# Patient Record
Sex: Female | Born: 1955 | Race: White | Hispanic: No | Smoking: Former smoker
Health system: Southern US, Community
[De-identification: ages and names within clinical notes are randomized; demographics above are authoritative.]

## PROBLEM LIST (undated history)

## (undated) DIAGNOSIS — F419 Anxiety disorder, unspecified: Secondary | ICD-10-CM

## (undated) DIAGNOSIS — K5090 Crohn's disease, unspecified, without complications: Secondary | ICD-10-CM

## (undated) DIAGNOSIS — M810 Age-related osteoporosis without current pathological fracture: Secondary | ICD-10-CM

## (undated) DIAGNOSIS — K5010 Crohn's disease of large intestine without complications: Secondary | ICD-10-CM

## (undated) DIAGNOSIS — I724 Aneurysm of artery of lower extremity: Secondary | ICD-10-CM

## (undated) HISTORY — DX: Crohn's disease, unspecified, without complications: K50.90

## (undated) HISTORY — PX: WISDOM TOOTH EXTRACTION: SHX21

## (undated) HISTORY — PX: COLON SURGERY: SHX602

## (undated) HISTORY — PX: ABDOMINAL AORTAGRAM: SHX5706

## (undated) HISTORY — PX: DILATION AND CURETTAGE OF UTERUS: SHX78

## (undated) HISTORY — PX: COLONOSCOPY W/ BIOPSIES AND POLYPECTOMY: SHX1376

## (undated) HISTORY — DX: Age-related osteoporosis without current pathological fracture: M81.0

## (undated) HISTORY — DX: Crohn's disease of large intestine without complications: K50.10

---

## 1998-09-19 ENCOUNTER — Inpatient Hospital Stay (HOSPITAL_COMMUNITY): Admission: AD | Admit: 1998-09-19 | Discharge: 1998-09-21 | Payer: Self-pay | Admitting: Obstetrics and Gynecology

## 1998-11-02 ENCOUNTER — Other Ambulatory Visit: Admission: RE | Admit: 1998-11-02 | Discharge: 1998-11-02 | Payer: Self-pay | Admitting: Obstetrics & Gynecology

## 2002-02-23 ENCOUNTER — Other Ambulatory Visit: Admission: RE | Admit: 2002-02-23 | Discharge: 2002-02-23 | Payer: Self-pay | Admitting: Obstetrics and Gynecology

## 2003-12-04 ENCOUNTER — Other Ambulatory Visit: Admission: RE | Admit: 2003-12-04 | Discharge: 2003-12-04 | Payer: Self-pay | Admitting: Obstetrics & Gynecology

## 2011-12-02 ENCOUNTER — Other Ambulatory Visit: Payer: Self-pay | Admitting: Obstetrics & Gynecology

## 2011-12-02 DIAGNOSIS — R928 Other abnormal and inconclusive findings on diagnostic imaging of breast: Secondary | ICD-10-CM

## 2011-12-08 ENCOUNTER — Ambulatory Visit
Admission: RE | Admit: 2011-12-08 | Discharge: 2011-12-08 | Disposition: A | Discharge status: Home / Self Care | Payer: BC Managed Care – PPO | Source: Ambulatory Visit | Attending: Obstetrics & Gynecology | Admitting: Obstetrics & Gynecology

## 2011-12-08 DIAGNOSIS — R928 Other abnormal and inconclusive findings on diagnostic imaging of breast: Secondary | ICD-10-CM

## 2012-07-27 ENCOUNTER — Ambulatory Visit (INDEPENDENT_AMBULATORY_CARE_PROVIDER_SITE_OTHER): Payer: BC Managed Care – PPO | Admitting: Family Medicine

## 2012-07-27 DIAGNOSIS — E538 Deficiency of other specified B group vitamins: Secondary | ICD-10-CM

## 2012-07-27 MED ORDER — CYANOCOBALAMIN 1000 MCG/ML IJ SOLN
1000.0000 ug | Freq: Once | INTRAMUSCULAR | Status: AC
  Administered 2012-07-27: 1000 ug via INTRAMUSCULAR

## 2012-08-03 ENCOUNTER — Other Ambulatory Visit: Payer: Self-pay | Admitting: Family Medicine

## 2012-08-03 ENCOUNTER — Ambulatory Visit
Admission: RE | Admit: 2012-08-03 | Discharge: 2012-08-03 | Disposition: A | Discharge status: Home / Self Care | Payer: BC Managed Care – PPO | Source: Ambulatory Visit | Attending: Physician Assistant | Admitting: Physician Assistant

## 2012-08-03 ENCOUNTER — Ambulatory Visit: Payer: BC Managed Care – PPO

## 2012-08-03 ENCOUNTER — Other Ambulatory Visit: Payer: Self-pay | Admitting: Physician Assistant

## 2012-08-03 DIAGNOSIS — M545 Low back pain: Secondary | ICD-10-CM

## 2012-08-05 ENCOUNTER — Telehealth: Payer: Self-pay | Admitting: Family Medicine

## 2012-08-05 NOTE — Telephone Encounter (Signed)
Pt called and made aware

## 2012-08-05 NOTE — Telephone Encounter (Signed)
Message copied by Donne Anon on Thu Aug 05, 2012 12:10 PM ------      Message from: Allayne Butcher      Created: Wed Aug 04, 2012  7:52 AM       One disc has slipped very minimally-only 4mm. Felt to be secondary to degenerative disc. No other significant findings-o/w nml.            LOV was 12/13. If having increased symptoms (increased back pain or if now with pain/numbness/tingling down either leg then needs to schedule f/u OV to further evaluate. ------

## 2012-08-27 ENCOUNTER — Ambulatory Visit: Payer: BC Managed Care – PPO

## 2012-08-30 ENCOUNTER — Ambulatory Visit (INDEPENDENT_AMBULATORY_CARE_PROVIDER_SITE_OTHER): Payer: BC Managed Care – PPO | Admitting: *Deleted

## 2012-08-30 DIAGNOSIS — E538 Deficiency of other specified B group vitamins: Secondary | ICD-10-CM

## 2012-08-31 MED ORDER — CYANOCOBALAMIN 1000 MCG/ML IJ SOLN: 1000.0000 ug | Freq: Once | INTRAMUSCULAR | Status: DC

## 2013-02-11 ENCOUNTER — Ambulatory Visit (INDEPENDENT_AMBULATORY_CARE_PROVIDER_SITE_OTHER): Payer: BC Managed Care – PPO | Admitting: Family Medicine

## 2013-02-11 DIAGNOSIS — E538 Deficiency of other specified B group vitamins: Secondary | ICD-10-CM

## 2013-02-11 MED ORDER — CYANOCOBALAMIN 1000 MCG/ML IJ SOLN
1000.0000 ug | Freq: Once | INTRAMUSCULAR | Status: AC
  Administered 2013-02-11: 1000 ug via INTRAMUSCULAR

## 2013-02-14 ENCOUNTER — Ambulatory Visit (INDEPENDENT_AMBULATORY_CARE_PROVIDER_SITE_OTHER): Payer: BC Managed Care – PPO | Admitting: Physician Assistant

## 2013-02-14 ENCOUNTER — Encounter: Payer: Self-pay | Admitting: Physician Assistant

## 2013-02-14 VITALS — BP 110/70 | HR 76 | Temp 98.1°F | Resp 18 | Ht 59.5 in | Wt 110.0 lb

## 2013-02-14 DIAGNOSIS — K5010 Crohn's disease of large intestine without complications: Secondary | ICD-10-CM | POA: Insufficient documentation

## 2013-02-14 DIAGNOSIS — L720 Epidermal cyst: Secondary | ICD-10-CM

## 2013-02-14 DIAGNOSIS — L723 Sebaceous cyst: Secondary | ICD-10-CM

## 2013-02-14 MED ORDER — SULFAMETHOXAZOLE-TMP DS 800-160 MG PO TABS: 1.0000 | ORAL_TABLET | Freq: Two times a day (BID) | ORAL | Status: DC

## 2013-02-14 NOTE — Progress Notes (Signed)
   Patient ID: Karen Barnett MRN: [id number], DOB: 11/09/1955, 57 y.o. Date of Encounter: 02/14/2013, 1:09 PM    Chief Complaint:  Chief Complaint  Patient presents with  . c/o lesion on left upper arm  . pain behind rt knee  . left neck pain    feels lump there  . rash in left inguinal area     HPI: 57 y.o. year old white female with multiple things to check:  1- itchy rash on left flank for about one week. Has used no medications other than just plain lotion.  2- has had cyst on left upper arm for years. Had caused no problems until just the past 2 or 3 weeks it became irritated. Last week it opened and drained some blood as well as some white drainage.  3- has noticed that she could feel a stronger pulse on the back of her right knee than on the left.     Home Meds: See attached medication section for any medications that were entered at today's visit. The computer does not put those onto this list.The following list is a list of meds entered prior to today's visit.   Current Outpatient Prescriptions on File Prior to Visit  Medication Sig Dispense Refill  . cyanocobalamin (,VITAMIN B-12,) 1000 MCG/ML injection Inject 1,000 mcg into the muscle every 30 (thirty) days.       Current Facility-Administered Medications on File Prior to Visit  Medication Dose Route Frequency Provider Last Rate Last Dose  . cyanocobalamin ((VITAMIN B-12)) injection 1,000 mcg  1,000 mcg Intramuscular Once Dorena Bodo, PA-C        Allergies: No Known Allergies    Review of Systems: See HPI for pertinent ROS. All other ROS negative.    Physical Exam: Blood pressure 110/70, pulse 76, temperature 98.1 F (36.7 C), temperature source Oral, resp. rate 18, height 4' 11.5" (1.511 m), weight 110 lb (49.896 kg)., Body mass index is 21.85 kg/(m^2). General: Well-nourished well-developed white female. Appears in no acute distress. Neck: Supple. No thyromegaly. No lymphadenopathy. Left neck has a very  small area of firmness consistent with a cyst. Approximately 1/3 cm diameter or less. It is mobile with  very distinct margins. Lungs: Clear bilaterally to auscultation without wheezes, rales, or rhonchi. Breathing is unlabored. Heart: Regular rhythm. No murmurs, rubs, or gallops. Msk:  Strength and tone normal for age. Elevated the posterior aspect of both knees and both were normal. I do feel a full bounding 2+ popliteal pulse bilaterally. Extremities/Skin: Left Flank : Approximate 1 inch diameter area with multiple scattered tiny pink papules. Left upper arm on the anterior lateral aspect: Approximate 1 cm area of firmness with some erythema. No draining. Neuro: Alert and oriented X 3. Moves all extremities spontaneously. Gait is normal. CNII-XII grossly in tact. Psych:  Responds to questions appropriately with a normal affect.     ASSESSMENT AND PLAN:  57 y.o. year old female with  1. Epidermoid cyst-Inflamed, Infected. This has already drained. Treat with antibiotics. Can return for resection of the cyst. - sulfamethoxazole-trimethoprim (BACTRIM DS) 800-160 MG per tablet; Take 1 tablet by mouth 2 (two) times daily.  Dispense: 20 tablet; Refill: 0  2- dermatitis of the left flank: Apply over-the-counter cortisone cream.  3- Cyst of left neck- Reassured her this is c/w cyst. Mobile, distint borders.   Murray Hodgkins Candelaria Arenas, [place], [facility] 02/14/2013 1:09 PM

## 2013-06-09 ENCOUNTER — Other Ambulatory Visit: Payer: Self-pay | Admitting: Physician Assistant

## 2013-06-10 NOTE — Telephone Encounter (Signed)
Medication refilled per protocol. 

## 2014-04-14 ENCOUNTER — Ambulatory Visit (INDEPENDENT_AMBULATORY_CARE_PROVIDER_SITE_OTHER): Payer: BC Managed Care – PPO | Admitting: *Deleted

## 2014-04-14 DIAGNOSIS — Z23 Encounter for immunization: Secondary | ICD-10-CM

## 2014-04-14 DIAGNOSIS — E538 Deficiency of other specified B group vitamins: Secondary | ICD-10-CM

## 2014-04-14 MED ORDER — CYANOCOBALAMIN 1000 MCG/ML IJ SOLN
1000.0000 ug | Freq: Once | INTRAMUSCULAR | Status: AC
  Administered 2014-04-14: 1000 ug via INTRAMUSCULAR

## 2014-06-27 ENCOUNTER — Ambulatory Visit (INDEPENDENT_AMBULATORY_CARE_PROVIDER_SITE_OTHER): Payer: BLUE CROSS/BLUE SHIELD | Admitting: Family Medicine

## 2014-06-27 DIAGNOSIS — E538 Deficiency of other specified B group vitamins: Secondary | ICD-10-CM

## 2014-06-27 MED ORDER — CYANOCOBALAMIN 1000 MCG/ML IJ SOLN
1000.0000 ug | Freq: Once | INTRAMUSCULAR | Status: AC
  Administered 2014-06-27: 1000 ug via INTRAMUSCULAR

## 2014-07-10 ENCOUNTER — Ambulatory Visit (INDEPENDENT_AMBULATORY_CARE_PROVIDER_SITE_OTHER): Payer: BLUE CROSS/BLUE SHIELD | Admitting: Family Medicine

## 2014-07-10 ENCOUNTER — Encounter: Payer: Self-pay | Admitting: Family Medicine

## 2014-07-10 VITALS — BP 110/82 | HR 76 | Temp 98.6°F | Resp 14 | Wt 111.0 lb

## 2014-07-10 DIAGNOSIS — I724 Aneurysm of artery of lower extremity: Secondary | ICD-10-CM

## 2014-07-10 LAB — CBC WITH DIFFERENTIAL/PLATELET
BASOS ABS: 0 10*3/uL (ref 0.0–0.1)
Basophils Relative: 0 % (ref 0–1)
EOS PCT: 1 % (ref 0–5)
Eosinophils Absolute: 0.1 10*3/uL (ref 0.0–0.7)
HCT: 39.7 % (ref 36.0–46.0)
Hemoglobin: 12.9 g/dL (ref 12.0–15.0)
LYMPHS ABS: 3.3 10*3/uL (ref 0.7–4.0)
LYMPHS PCT: 38 % (ref 12–46)
MCH: 29.6 pg (ref 26.0–34.0)
MCHC: 32.5 g/dL (ref 30.0–36.0)
MCV: 91.1 fL (ref 78.0–100.0)
MPV: 9.6 fL (ref 8.6–12.4)
Monocytes Absolute: 0.6 10*3/uL (ref 0.1–1.0)
Monocytes Relative: 7 % (ref 3–12)
Neutro Abs: 4.6 10*3/uL (ref 1.7–7.7)
Neutrophils Relative %: 54 % (ref 43–77)
PLATELETS: 304 10*3/uL (ref 150–400)
RBC: 4.36 MIL/uL (ref 3.87–5.11)
RDW: 15.2 % (ref 11.5–15.5)
WBC: 8.6 10*3/uL (ref 4.0–10.5)

## 2014-07-10 LAB — COMPLETE METABOLIC PANEL WITH GFR
ALBUMIN: 4.3 g/dL (ref 3.5–5.2)
ALK PHOS: 51 U/L (ref 39–117)
ALT: 15 U/L (ref 0–35)
AST: 16 U/L (ref 0–37)
BUN: 8 mg/dL (ref 6–23)
CALCIUM: 9.4 mg/dL (ref 8.4–10.5)
CHLORIDE: 102 meq/L (ref 96–112)
CO2: 31 mEq/L (ref 19–32)
Creat: 0.69 mg/dL (ref 0.50–1.10)
GFR, Est African American: >89 mL/min
GFR, Est Non African American: >89 mL/min
GLUCOSE: 86 mg/dL (ref 70–99)
POTASSIUM: 4.3 meq/L (ref 3.5–5.3)
Sodium: 140 mEq/L (ref 135–145)
TOTAL PROTEIN: 6.1 g/dL (ref 6.0–8.3)
Total Bilirubin: 0.4 mg/dL (ref 0.2–1.2)

## 2014-07-10 MED ORDER — CYANOCOBALAMIN 1000 MCG/ML IJ SOLN: INTRAMUSCULAR | Status: DC

## 2014-07-10 NOTE — Progress Notes (Signed)
   Subjective:    Patient ID: Karen Barnett, female    DOB: May 24, 1955, 59 y.o.   MRN: [id number]  HPI Patient presents today with stiffness in her right knee. The stiffness has been there for approximately 1 year. She denies any injuries. She denies any swelling. She states that the stiffness and pain pulses at times. She denies any symptoms of claudication in the legs. She denies any back pain or radiation of the pain down the leg. She denies any erythema or effusion in the knee. Past Medical History  Diagnosis Date  . Crohn's colitis    No past surgical history on file. No current outpatient prescriptions on file prior to visit.   No current facility-administered medications on file prior to visit.   No Known Allergies History   Social History  . Marital Status: Single    Spouse Name: N/A  . Number of Children: N/A  . Years of Education: N/A   Occupational History  . Not on file.   Social History Main Topics  . Smoking status: Former Smoker    Quit date: 02/14/2009  . Smokeless tobacco: Never Used  . Alcohol Use: Not on file  . Drug Use: Not on file  . Sexual Activity: Not on file   Other Topics Concern  . Not on file   Social History Narrative      Review of Systems  All other systems reviewed and are negative.      Objective:   Physical Exam  Cardiovascular: Normal rate, regular rhythm and normal heart sounds.   Pulses:      Femoral pulses are 2+ on the right side, and 2+ on the left side.      Popliteal pulses are 3+ on the right side, and 2+ on the left side.  Pulmonary/Chest: Effort normal and breath sounds normal. No respiratory distress. She has no wheezes. She has no rales.  Abdominal: Soft. Bowel sounds are normal. She exhibits no distension. There is no tenderness. There is no rebound.  Musculoskeletal:       Right knee: She exhibits deformity. She exhibits normal range of motion, no swelling, no effusion, no ecchymosis, no erythema, normal  alignment, no LCL laxity, normal patellar mobility, no bony tenderness, normal meniscus and no MCL laxity. No tenderness found. No medial joint line, no lateral joint line, no MCL and no LCL tenderness noted.  Vitals reviewed.  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       Assessment & Plan:  Popliteal artery aneurysm - Plan: CBC with Differential/Platelet, COMPLETE METABOLIC PANEL WITH GFR, Lower Extremity Arterial Doppler Bilateral  I will schedule the patient for a vascular ultrasound/lower sternal the arterial Doppler to evaluate further. If this confirms a popliteal artery aneurysm, I would recommend referral to vascular surgeon for operative repair.

## 2014-07-11 ENCOUNTER — Encounter: Payer: Self-pay | Admitting: Family Medicine

## 2014-07-18 ENCOUNTER — Other Ambulatory Visit (HOSPITAL_COMMUNITY): Payer: Self-pay | Admitting: Cardiology

## 2014-07-18 ENCOUNTER — Ambulatory Visit (HOSPITAL_COMMUNITY): Payer: BLUE CROSS/BLUE SHIELD | Attending: Cardiovascular Disease | Admitting: *Deleted

## 2014-07-18 ENCOUNTER — Other Ambulatory Visit: Payer: Self-pay | Admitting: Family Medicine

## 2014-07-18 DIAGNOSIS — I724 Aneurysm of artery of lower extremity: Secondary | ICD-10-CM | POA: Diagnosis present

## 2014-07-18 NOTE — Progress Notes (Signed)
Bilateral lower extremity arterial duplex

## 2014-07-19 ENCOUNTER — Telehealth: Payer: Self-pay | Admitting: Physician Assistant

## 2014-07-19 ENCOUNTER — Telehealth: Payer: Self-pay | Admitting: *Deleted

## 2014-07-19 NOTE — Telephone Encounter (Signed)
Pt [facility] on Tues March 22 at 1:15 for 2:00pm appt, left message on voice mail for pt to return my call for appt date/time and location

## 2014-07-19 NOTE — Telephone Encounter (Signed)
Pt aware of appt.

## 2014-07-19 NOTE — Telephone Encounter (Signed)
Harriet from [place] vascular lab wanting to talk with you regarding patient having a aneurysm  Please call her at [phone]

## 2014-07-20 NOTE — Telephone Encounter (Signed)
Completed by referral coordinator

## 2014-07-24 ENCOUNTER — Encounter: Payer: Self-pay | Admitting: Vascular Surgery

## 2014-07-25 ENCOUNTER — Ambulatory Visit (HOSPITAL_COMMUNITY)
Admission: RE | Admit: 2014-07-25 | Discharge: 2014-07-25 | Disposition: A | Discharge status: Home / Self Care | Payer: BLUE CROSS/BLUE SHIELD | Source: Ambulatory Visit | Attending: Vascular Surgery | Admitting: Vascular Surgery

## 2014-07-25 ENCOUNTER — Ambulatory Visit (INDEPENDENT_AMBULATORY_CARE_PROVIDER_SITE_OTHER): Payer: BLUE CROSS/BLUE SHIELD | Admitting: Vascular Surgery

## 2014-07-25 ENCOUNTER — Other Ambulatory Visit: Payer: Self-pay

## 2014-07-25 ENCOUNTER — Encounter: Payer: Self-pay | Admitting: Vascular Surgery

## 2014-07-25 VITALS — BP 129/84 | HR 79 | Resp 16 | Ht 59.0 in | Wt 108.0 lb

## 2014-07-25 DIAGNOSIS — Z01810 Encounter for preprocedural cardiovascular examination: Secondary | ICD-10-CM | POA: Diagnosis not present

## 2014-07-25 DIAGNOSIS — I724 Aneurysm of artery of lower extremity: Secondary | ICD-10-CM

## 2014-07-25 NOTE — Progress Notes (Signed)
Subjective:     Patient ID: Karen Barnett, female   DOB: 09/17/1955, 59 y.o.   MRN: [id number]  HPI this 59-year-old female was referred for evaluation of her right popliteal artery aneurysm. She has been having pain behind the right knee joint for the past 12 months which has increased in intensity. She has no history of trauma to this area. She denies a history of abdominal aortic aneurysm or a family history of abdominal aortic aneurysm. She does have a grandfather had a cerebral aneurysm. 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 She has no history of ischemia of the right foot. She denies claudication in the right calf able to ambulate long distances. She has a history of smoking but does not currently smoke.  Past Medical History  Diagnosis Date  . Crohn's colitis   . Crohn's disease     History  Substance Use Topics  . Smoking status: Former Smoker    Quit date: 02/14/2009  . Smokeless tobacco: Never Used  . Alcohol Use: 2.4 oz/week    4 Shots of liquor per week    Family History  Problem Relation Age of Onset  . Diabetes Father   . Diabetes Brother     No Known Allergies   Current outpatient prescriptions:  .  cyanocobalamin (,VITAMIN B-12,) 1000 MCG/ML injection, INJECT 1ML EVERY MONTH, Disp: 10 mL, Rfl: 0  Filed Vitals:   07/25/14 1337  BP: 129/84  Pulse: 79  Resp: 16  Height: 4' 11" (1.499 m)  Weight: 108 lb (48.988 kg)    Body mass index is 21.8 kg/(m^2).           Review of Systems denies chest pain, dyspnea on exertion, PND, orthopnea    does have a history of Crohn's colitis.Other systems negative and a complete review of systems  Objective:   Physical Exam BP 129/84 mmHg  Pulse 79  Resp 16  Ht 4' 11" (1.499 m)  Wt 108 lb (48.988 kg)  BMI 21.80 kg/m2  Gen.-alert and oriented x3 in no apparent distress HEENT normal for age Lungs no rhonchi or wheezing Cardiovascular regular rhythm no murmurs carotid  pulses 3+ palpable no bruits audible Abdomen soft nontender no palpable masses Musculoskeletal free of  major deformities Skin clear -no rashes Neurologic normal Lower extremities 3+ femoral and 2+ dorsalis pedis and posterior tibial pulses palpable bilaterally with no edema 4 cm pulsatile mass in right popliteal fossa. No popliteal mass noted on the left side.  Today I reviewed the report of the lower extremity scan performed at another vascular lab last week. 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      

## 2014-07-31 MED ORDER — SODIUM CHLORIDE 0.9 % IV SOLN
INTRAVENOUS | Status: DC
  Administered 2014-08-01: 10:00:00 via INTRAVENOUS

## 2014-08-01 ENCOUNTER — Ambulatory Visit (HOSPITAL_COMMUNITY)
Admission: RE | Admit: 2014-08-01 | Discharge: 2014-08-01 | Disposition: A | Discharge status: Home / Self Care | Payer: BLUE CROSS/BLUE SHIELD | Source: Ambulatory Visit | Attending: Surgery | Admitting: Surgery

## 2014-08-01 ENCOUNTER — Other Ambulatory Visit: Payer: Self-pay

## 2014-08-01 ENCOUNTER — Encounter (HOSPITAL_COMMUNITY)
Admission: RE | Disposition: A | Discharge status: Home / Self Care | Payer: Self-pay | Source: Ambulatory Visit | Attending: Surgery

## 2014-08-01 ENCOUNTER — Ambulatory Visit (HOSPITAL_COMMUNITY): Payer: BLUE CROSS/BLUE SHIELD

## 2014-08-01 ENCOUNTER — Encounter (HOSPITAL_COMMUNITY): Payer: Self-pay | Admitting: *Deleted

## 2014-08-01 ENCOUNTER — Encounter (HOSPITAL_COMMUNITY): Payer: Self-pay | Admitting: Vascular Surgery

## 2014-08-01 DIAGNOSIS — K5900 Constipation, unspecified: Secondary | ICD-10-CM | POA: Diagnosis not present

## 2014-08-01 DIAGNOSIS — I724 Aneurysm of artery of lower extremity: Secondary | ICD-10-CM | POA: Diagnosis not present

## 2014-08-01 DIAGNOSIS — Z87891 Personal history of nicotine dependence: Secondary | ICD-10-CM

## 2014-08-01 DIAGNOSIS — I7789 Other specified disorders of arteries and arterioles: Secondary | ICD-10-CM

## 2014-08-01 DIAGNOSIS — K5010 Crohn's disease of large intestine without complications: Secondary | ICD-10-CM | POA: Diagnosis present

## 2014-08-01 HISTORY — DX: Anxiety disorder, unspecified: F41.9

## 2014-08-01 HISTORY — DX: Aneurysm of artery of lower extremity: I72.4

## 2014-08-01 HISTORY — PX: ABDOMINAL AORTAGRAM: SHX5454

## 2014-08-01 LAB — CBC
HCT: 35.3 % — ABNORMAL LOW (ref 36.0–46.0)
Hemoglobin: 11.7 g/dL — ABNORMAL LOW (ref 12.0–15.0)
MCH: 29.4 pg (ref 26.0–34.0)
MCHC: 33.1 g/dL (ref 30.0–36.0)
MCV: 88.7 fL (ref 78.0–100.0)
Platelets: 236 10*3/uL (ref 150–400)
RBC: 3.98 MIL/uL (ref 3.87–5.11)
RDW: 14.5 % (ref 11.5–15.5)
WBC: 6.2 10*3/uL (ref 4.0–10.5)

## 2014-08-01 LAB — COMPREHENSIVE METABOLIC PANEL
ALK PHOS: 45 U/L (ref 39–117)
ALT: 14 U/L (ref 0–35)
ANION GAP: 9 (ref 5–15)
AST: 16 U/L (ref 0–37)
Albumin: 3.4 g/dL — ABNORMAL LOW (ref 3.5–5.2)
BUN: 7 mg/dL (ref 6–23)
CO2: 25 mmol/L (ref 19–32)
CREATININE: 0.63 mg/dL (ref 0.50–1.10)
Calcium: 8.6 mg/dL (ref 8.4–10.5)
Chloride: 106 mmol/L (ref 96–112)
GFR calc Af Amer: >90 mL/min (ref >90)
Glucose, Bld: 141 mg/dL — ABNORMAL HIGH (ref 70–99)
POTASSIUM: 3.6 mmol/L (ref 3.5–5.1)
SODIUM: 140 mmol/L (ref 135–145)
Total Bilirubin: 0.6 mg/dL (ref 0.3–1.2)
Total Protein: 5.3 g/dL — ABNORMAL LOW (ref 6.0–8.3)

## 2014-08-01 LAB — APTT: aPTT: 30 seconds (ref 24–37)

## 2014-08-01 LAB — PROTIME-INR
INR: 1.06 (ref 0.00–1.49)
Prothrombin Time: 13.9 seconds (ref 11.6–15.2)

## 2014-08-01 LAB — URINALYSIS, ROUTINE W REFLEX MICROSCOPIC
Bilirubin Urine: NEGATIVE
Glucose, UA: NEGATIVE mg/dL
Hgb urine dipstick: NEGATIVE
Ketones, ur: NEGATIVE mg/dL
Leukocytes, UA: NEGATIVE
Nitrite: NEGATIVE
Protein, ur: 30 mg/dL — AB
Specific Gravity, Urine: 1.015 (ref 1.005–1.030)
Urobilinogen, UA: 0.2 mg/dL (ref 0.0–1.0)
pH: 5 (ref 5.0–8.0)

## 2014-08-01 LAB — TYPE AND SCREEN
ABO/RH(D): AB POS
Antibody Screen: NEGATIVE

## 2014-08-01 LAB — POCT I-STAT, CHEM 8
BUN: 7 mg/dL (ref 6–23)
CHLORIDE: 102 mmol/L (ref 96–112)
CREATININE: 0.6 mg/dL (ref 0.50–1.10)
Calcium, Ion: 1.2 mmol/L (ref 1.12–1.23)
Glucose, Bld: 98 mg/dL (ref 70–99)
HEMATOCRIT: 40 % (ref 36.0–46.0)
Hemoglobin: 13.6 g/dL (ref 12.0–15.0)
Potassium: 3.6 mmol/L (ref 3.5–5.1)
Sodium: 141 mmol/L (ref 135–145)
TCO2: 25 mmol/L (ref 0–100)

## 2014-08-01 LAB — URINE MICROSCOPIC-ADD ON

## 2014-08-01 LAB — SURGICAL PCR SCREEN
MRSA, PCR: NEGATIVE
STAPHYLOCOCCUS AUREUS: POSITIVE — AB

## 2014-08-01 LAB — ABO/RH: ABO/RH(D): AB POS

## 2014-08-01 SURGERY — ABDOMINAL AORTAGRAM

## 2014-08-01 MED ORDER — METOPROLOL TARTRATE 1 MG/ML IV SOLN
2.0000 mg | INTRAVENOUS | Status: DC | PRN
Start: 2014-08-01 — End: 2014-08-01

## 2014-08-01 MED ORDER — MORPHINE SULFATE 10 MG/ML IJ SOLN: 2.0000 mg | INTRAMUSCULAR | Status: DC | PRN

## 2014-08-01 MED ORDER — CHLORHEXIDINE GLUCONATE CLOTH 2 % EX PADS: 6.0000 | MEDICATED_PAD | Freq: Once | CUTANEOUS | Status: DC

## 2014-08-01 MED ORDER — DOCUSATE SODIUM 100 MG PO CAPS: 100.0000 mg | ORAL_CAPSULE | Freq: Every day | ORAL | Status: DC

## 2014-08-01 MED ORDER — SODIUM CHLORIDE 0.9 % IV SOLN: 1.0000 mL/kg/h | INTRAVENOUS | Status: DC

## 2014-08-01 MED ORDER — PHENOL 1.4 % MT LIQD
1.0000 | OROMUCOSAL | Status: DC | PRN
Start: 2014-08-01 — End: 2014-08-01
  Filled 2014-08-01: qty 177

## 2014-08-01 MED ORDER — ACETAMINOPHEN 325 MG PO TABS: 325.0000 mg | ORAL_TABLET | ORAL | Status: DC | PRN

## 2014-08-01 MED ORDER — ONDANSETRON HCL 4 MG/2ML IJ SOLN: 4.0000 mg | Freq: Four times a day (QID) | INTRAMUSCULAR | Status: DC | PRN

## 2014-08-01 MED ORDER — MIDAZOLAM HCL 2 MG/2ML IJ SOLN
INTRAMUSCULAR | Status: AC
  Filled 2014-08-01: qty 2

## 2014-08-01 MED ORDER — OXYCODONE HCL 5 MG PO TABS: 5.0000 mg | ORAL_TABLET | ORAL | Status: DC | PRN

## 2014-08-01 MED ORDER — DEXTROSE 5 % IV SOLN
1.5000 g | INTRAVENOUS | Status: AC
  Administered 2014-08-02: 1.5 g via INTRAVENOUS
  Filled 2014-08-01: qty 1.5

## 2014-08-01 MED ORDER — SODIUM CHLORIDE 0.9 % IV SOLN: INTRAVENOUS | Status: DC

## 2014-08-01 MED ORDER — LIDOCAINE HCL (PF) 1 % IJ SOLN
INTRAMUSCULAR | Status: AC
  Filled 2014-08-01: qty 30

## 2014-08-01 MED ORDER — LABETALOL HCL 5 MG/ML IV SOLN: 10.0000 mg | INTRAVENOUS | Status: DC | PRN

## 2014-08-01 MED ORDER — ALUM & MAG HYDROXIDE-SIMETH 200-200-20 MG/5ML PO SUSP: 15.0000 mL | ORAL | Status: DC | PRN

## 2014-08-01 MED ORDER — HEPARIN (PORCINE) IN NACL 2-0.9 UNIT/ML-% IJ SOLN
INTRAMUSCULAR | Status: AC
  Filled 2014-08-01: qty 1000

## 2014-08-01 MED ORDER — GUAIFENESIN-DM 100-10 MG/5ML PO SYRP
15.0000 mL | ORAL_SOLUTION | ORAL | Status: DC | PRN
  Filled 2014-08-01: qty 15

## 2014-08-01 MED ORDER — HYDRALAZINE HCL 20 MG/ML IJ SOLN: 5.0000 mg | INTRAMUSCULAR | Status: DC | PRN

## 2014-08-01 MED ORDER — ACETAMINOPHEN 325 MG RE SUPP: 325.0000 mg | RECTAL | Status: DC | PRN

## 2014-08-01 MED ORDER — FENTANYL CITRATE 0.05 MG/ML IJ SOLN
INTRAMUSCULAR | Status: AC
  Filled 2014-08-01: qty 2

## 2014-08-01 SURGICAL SUPPLY — 53 items
BANDAGE ELASTIC 4 VELCRO ST LF (GAUZE/BANDAGES/DRESSINGS) IMPLANT
BANDAGE ESMARK 6X9 LF (GAUZE/BANDAGES/DRESSINGS) IMPLANT
BNDG ESMARK 6X9 LF (GAUZE/BANDAGES/DRESSINGS)
CANISTER SUCTION 2500CC (MISCELLANEOUS) ×3 IMPLANT
CLIP TI MEDIUM 24 (CLIP) ×3 IMPLANT
CLIP TI WIDE RED SMALL 24 (CLIP) ×3 IMPLANT
COVER SURGICAL LIGHT HANDLE (MISCELLANEOUS) ×3 IMPLANT
CUFF TOURNIQUET SINGLE 24IN (TOURNIQUET CUFF) IMPLANT
CUFF TOURNIQUET SINGLE 34IN LL (TOURNIQUET CUFF) IMPLANT
CUFF TOURNIQUET SINGLE 44IN (TOURNIQUET CUFF) IMPLANT
DERMABOND ADVANCED (GAUZE/BANDAGES/DRESSINGS) ×2
DERMABOND ADVANCED .7 DNX12 (GAUZE/BANDAGES/DRESSINGS) ×1 IMPLANT
DRAIN CHANNEL 15F RND FF W/TCR (WOUND CARE) IMPLANT
DRAPE WARM FLUID 44X44 (DRAPE) ×3 IMPLANT
DRAPE X-RAY CASS 24X20 (DRAPES) IMPLANT
DRSG COVADERM 4X10 (GAUZE/BANDAGES/DRESSINGS) IMPLANT
DRSG COVADERM 4X8 (GAUZE/BANDAGES/DRESSINGS) IMPLANT
ELECT REM PT RETURN 9FT ADLT (ELECTROSURGICAL) ×3
ELECTRODE REM PT RTRN 9FT ADLT (ELECTROSURGICAL) ×1 IMPLANT
EVACUATOR SILICONE 100CC (DRAIN) IMPLANT
GLOVE BIOGEL PI IND STRL 7.5 (GLOVE) ×1 IMPLANT
GLOVE BIOGEL PI INDICATOR 7.5 (GLOVE) ×2
GLOVE SURG SS PI 7.5 STRL IVOR (GLOVE) ×3 IMPLANT
GOWN PREVENTION PLUS XXLARGE (GOWN DISPOSABLE) ×3 IMPLANT
GOWN STRL NON-REIN LRG LVL3 (GOWN DISPOSABLE) ×9 IMPLANT
HEMOSTAT SNOW SURGICEL 2X4 (HEMOSTASIS) IMPLANT
KIT BASIN OR (CUSTOM PROCEDURE TRAY) ×3 IMPLANT
KIT ROOM TURNOVER OR (KITS) ×3 IMPLANT
MARKER GRAFT CORONARY BYPASS (MISCELLANEOUS) IMPLANT
NS IRRIG 1000ML POUR BTL (IV SOLUTION) ×6 IMPLANT
PACK PERIPHERAL VASCULAR (CUSTOM PROCEDURE TRAY) ×3 IMPLANT
PAD ARMBOARD 7.5X6 YLW CONV (MISCELLANEOUS) ×6 IMPLANT
PADDING CAST COTTON 6X4 STRL (CAST SUPPLIES) IMPLANT
SET COLLECT BLD 21X3/4 12 (NEEDLE) IMPLANT
STOPCOCK 4 WAY LG BORE MALE ST (IV SETS) IMPLANT
SUT ETHILON 3 0 PS 1 (SUTURE) IMPLANT
SUT PROLENE 5 0 C 1 24 (SUTURE) ×3 IMPLANT
SUT PROLENE 6 0 BV (SUTURE) ×3 IMPLANT
SUT PROLENE 7 0 BV 1 (SUTURE) IMPLANT
SUT SILK 2 0 SH (SUTURE) ×3 IMPLANT
SUT SILK 3 0 (SUTURE)
SUT SILK 3-0 18XBRD TIE 12 (SUTURE) IMPLANT
SUT VIC AB 2-0 CT1 27 (SUTURE) ×4
SUT VIC AB 2-0 CT1 TAPERPNT 27 (SUTURE) ×2 IMPLANT
SUT VIC AB 3-0 SH 27 (SUTURE) ×4
SUT VIC AB 3-0 SH 27X BRD (SUTURE) ×2 IMPLANT
SUT VICRYL 4-0 PS2 18IN ABS (SUTURE) ×6 IMPLANT
TOWEL OR 17X24 6PK STRL BLUE (TOWEL DISPOSABLE) ×6 IMPLANT
TOWEL OR 17X26 10 PK STRL BLUE (TOWEL DISPOSABLE) ×6 IMPLANT
TRAY FOLEY CATH 16FRSI W/METER (SET/KITS/TRAYS/PACK) ×3 IMPLANT
TUBING EXTENTION W/L.L. (IV SETS) IMPLANT
UNDERPAD 30X30 INCONTINENT (UNDERPADS AND DIAPERS) ×3 IMPLANT
WATER STERILE IRR 1000ML POUR (IV SOLUTION) ×3 IMPLANT

## 2014-08-01 NOTE — Progress Notes (Signed)
Anesthesia Chart Review: patient is a 59 year old female posted from right [facility] tomorrow with Dr. Hart RochesterLawson.  She is having a aortobifemoral angiography today.   History include Crohn's disease with prior colon surgery, former smoker, right popliteal artery aneurysm. PCP is Dr. Tanya NonesPickard.  08/01/14 EKG: NSR, possible LAE, right BBB. There are no comparison EKGs in Muse or Epic.  Abdominal/BLE duplex on 07/19/14: Normal caliber abdominal aorta and bilateral iliac arteries. Normal caliber bilateral CFA, SFA, and left popliteal artery. Fusiform aneurysm of the right popliteal artery, measuring 3.7cm X 3.2 cm.   Labs noted.  No UA results available.   Anticipate that she can proceed as planned.  Velna Ochsllison Vaun Hyndman, PA-C [facility] Phone [phone] 08/01/2014 5:18 PM

## 2014-08-01 NOTE — Interval H&P Note (Signed)
History and Physical Interval Note:  08/01/2014 11:18 AM  Karen Barnett  has presented today for surgery, with the diagnosis of right pop anorizium  The various methods of treatment have been discussed with the patient and family. After consideration of risks, benefits and other options for treatment, the patient has consented to  Procedure(s): ABDOMINAL AORTAGRAM (N/A) as a surgical intervention .  The patient's history has been reviewed, patient examined, no change in status, stable for surgery.  I have reviewed the patient's chart and labs.  Questions were answered to the patient's satisfaction.     Tiana Sivertson IV, V. WELLS

## 2014-08-01 NOTE — H&P (View-Only) (Signed)
Subjective:     Patient ID: Karen CantorCathy F Barnett, female   DOB: 18-Oct-1955, 59 y.o.   MRN: [id number]  HPI this 59 year old female was referred for evaluation of her right popliteal artery aneurysm. She has been having pain behind the right knee joint for the past 12 months which has increased in intensity. She has no history of trauma to this area. She denies a history of abdominal aortic aneurysm or a family history of abdominal aortic aneurysm. She does have a grandfather had a cerebral aneurysm. 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 She has no history of ischemia of the right foot. She denies claudication in the right calf able to ambulate long distances. She has a history of smoking but does not currently smoke.  Past Medical History  Diagnosis Date  . Crohn's colitis   . Crohn's disease     History  Substance Use Topics  . Smoking status: Former Smoker    Quit date: 02/14/2009  . Smokeless tobacco: Never Used  . Alcohol Use: 2.4 oz/week    4 Shots of liquor per week    Family History  Problem Relation Age of Onset  . Diabetes Father   . Diabetes Brother     No Known Allergies   Current outpatient prescriptions:  .  cyanocobalamin (,VITAMIN B-12,) 1000 MCG/ML injection, INJECT 1ML EVERY MONTH, Disp: 10 mL, Rfl: 0  Filed Vitals:   07/25/14 1337  BP: 129/84  Pulse: 79  Resp: 16  Height: 4\' 11"  (1.499 m)  Weight: 108 lb (48.988 kg)    Body mass index is 21.8 kg/(m^2).           Review of Systems denies chest pain, dyspnea on exertion, PND, orthopnea    does have a history of Crohn's colitis.Other systems negative and a complete review of systems  Objective:   Physical Exam BP 129/84 mmHg  Pulse 79  Resp 16  Ht 4\' 11"  (1.499 m)  Wt 108 lb (48.988 kg)  BMI 21.80 kg/m2  Gen.-alert and oriented x3 in no apparent distress HEENT normal for age Lungs no rhonchi or wheezing Cardiovascular regular rhythm no murmurs carotid  pulses 3+ palpable no bruits audible Abdomen soft nontender no palpable masses Musculoskeletal free of  major deformities Skin clear -no rashes Neurologic normal Lower extremities 3+ femoral and 2+ dorsalis pedis and posterior tibial pulses palpable bilaterally with no edema 4 cm pulsatile mass in right popliteal fossa. No popliteal mass noted on the left side.  Today I reviewed the report of the lower extremity scan performed at another vascular lab last week. 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

## 2014-08-01 NOTE — Discharge Instructions (Signed)

## 2014-08-01 NOTE — Progress Notes (Signed)
Pt denies SOB, chest pain, and being under the care of a cardiologist. Pt denies having a chest x ray within the last year. Pt denies having a stress test, echo and cardiac cath. Revonda StandardAllison, PA ( anesthesia) made aware of EKG. Please treat pt DOS if positive for staph or MRSA.

## 2014-08-01 NOTE — Op Note (Signed)
    Patient name: Karen CantorCathy F Barnett MRN: [id number] DOB: 07-18-1955 Sex: female  08/01/2014 Pre-operative Diagnosis: Right popliteal aneurysm Post-operative diagnosis:  Same Surgeon:  Jorge NyBRABHAM IV, V. WELLS Procedure Performed:  1.  Ultrasound-guided access, left femoral artery  2.  Abdominal aortogram  3.  Bilateral lower extremity runoff  4.  Second order catheterization    Indications:  Patient is here for preoperative planning for popliteal aneurysm repair.  Procedure:  The patient was identified in the holding area and taken to room 8.  The patient was then placed supine on the table and prepped and draped in the usual sterile fashion.  A time out was called.  Ultrasound was used to evaluate the left common femoral artery.  It was patent .  A digital ultrasound image was acquired.  A micropuncture needle was used to access the left common femoral artery under ultrasound guidance.  An 018 wire was advanced without resistance and a micropuncture sheath was placed.  The 018 wire was removed and a benson wire was placed.  The micropuncture sheath was exchanged for a 5 french sheath.  An omniflush catheter was advanced over the wire to the level of L-1.  An abdominal angiogram was obtained.  Next, using the omniflush catheter and a benson wire, the aortic bifurcation was crossed and the catheter was placed into theright external iliac artery and right runoff was obtained.  left runoff was performed via retrograde sheath injections.  Findings:   Aortogram:  The suprarenal abdominal aorta is widely patent.  Bilateral renal arteries are widely patent.  The infrarenal abdominal aorta and bilateral common and external iliac arteries are widely patent.  Right Lower Extremity:  The right common femoral, profundofemoral and superficial femoral artery are widely patent.  Aneurysmal dilatation of the popliteal artery is identified.  There is a normal caliber below knee popliteal artery with three-vessel  runoff  Left Lower Extremity:  Left common femoral, profundofemoral, and superficial femoral artery are widely patent.  The popliteal artery is widely patent.  There is three-vessel runoff.  There is a abnormal takeoff of the anterior tibial artery, proximal to the joint space.  Intervention:  None  Impression:  #1  right popliteal artery aneurysm  #2  abnormal left anterior tibial artery.  It originates at the joint space  #3  three-vessel runoff bilaterally    V. Durene CalWells Rayshaun Needle, M.D. [facility] Office: [phone] Pager:  [phone]

## 2014-08-01 NOTE — Progress Notes (Signed)
Site area: left groin Site Prior to Removal:  Level  0 Pressure Applied For:  20 minutes Manual:   yes Patient Status During Pull:  stable Post Pull Site:  Level  0 Post Pull Instructions Given:  yes Post Pull Pulses Present: yes Dressing Applied:  tegaderm Bedrest begins @ 1340 Comments:  0 complications

## 2014-08-02 ENCOUNTER — Encounter (HOSPITAL_COMMUNITY)
Admission: RE | Disposition: A | Discharge status: Home / Self Care | Payer: Self-pay | Source: Ambulatory Visit | Attending: Vascular Surgery

## 2014-08-02 ENCOUNTER — Inpatient Hospital Stay (HOSPITAL_COMMUNITY): Payer: BLUE CROSS/BLUE SHIELD | Admitting: Vascular Surgery

## 2014-08-02 ENCOUNTER — Encounter (HOSPITAL_COMMUNITY): Payer: Self-pay | Admitting: Anesthesiology

## 2014-08-02 ENCOUNTER — Telehealth: Payer: Self-pay | Admitting: Vascular Surgery

## 2014-08-02 ENCOUNTER — Inpatient Hospital Stay (HOSPITAL_COMMUNITY)
Admission: RE | Admit: 2014-08-02 | Discharge: 2014-08-05 | DRG: 253 | Disposition: A | Discharge status: Home / Self Care | Payer: BLUE CROSS/BLUE SHIELD | Source: Ambulatory Visit | Attending: Vascular Surgery | Admitting: Vascular Surgery

## 2014-08-02 DIAGNOSIS — K5900 Constipation, unspecified: Secondary | ICD-10-CM | POA: Diagnosis not present

## 2014-08-02 DIAGNOSIS — I724 Aneurysm of artery of lower extremity: Secondary | ICD-10-CM | POA: Diagnosis present

## 2014-08-02 DIAGNOSIS — K5010 Crohn's disease of large intestine without complications: Secondary | ICD-10-CM | POA: Diagnosis present

## 2014-08-02 DIAGNOSIS — Z87891 Personal history of nicotine dependence: Secondary | ICD-10-CM | POA: Diagnosis not present

## 2014-08-02 HISTORY — PX: FEMORAL-POPLITEAL BYPASS GRAFT: SHX937

## 2014-08-02 HISTORY — PX: VEIN HARVEST: SHX6363

## 2014-08-02 LAB — CBC
HEMATOCRIT: 35.3 % — AB (ref 36.0–46.0)
Hemoglobin: 11.7 g/dL — ABNORMAL LOW (ref 12.0–15.0)
MCH: 29.9 pg (ref 26.0–34.0)
MCHC: 33.1 g/dL (ref 30.0–36.0)
MCV: 90.3 fL (ref 78.0–100.0)
Platelets: 210 10*3/uL (ref 150–400)
RBC: 3.91 MIL/uL (ref 3.87–5.11)
RDW: 14.8 % (ref 11.5–15.5)
WBC: 10.7 10*3/uL — AB (ref 4.0–10.5)

## 2014-08-02 LAB — CREATININE, SERUM: CREATININE: 0.58 mg/dL (ref 0.50–1.10)

## 2014-08-02 SURGERY — BYPASS GRAFT FEMORAL-POPLITEAL ARTERY
Anesthesia: General | Site: Leg Upper | Laterality: Right

## 2014-08-02 MED ORDER — PROPOFOL 10 MG/ML IV BOLUS
INTRAVENOUS | Status: AC
  Filled 2014-08-02: qty 20

## 2014-08-02 MED ORDER — GLYCOPYRROLATE 0.2 MG/ML IJ SOLN
INTRAMUSCULAR | Status: DC | PRN
  Administered 2014-08-02: 0.2 mg via INTRAVENOUS
  Administered 2014-08-02: 0.1 mg via INTRAVENOUS

## 2014-08-02 MED ORDER — LIDOCAINE HCL (CARDIAC) 20 MG/ML IV SOLN
INTRAVENOUS | Status: AC
  Filled 2014-08-02: qty 5

## 2014-08-02 MED ORDER — SODIUM CHLORIDE 0.9 % IJ SOLN
INTRAMUSCULAR | Status: AC
  Filled 2014-08-02: qty 10

## 2014-08-02 MED ORDER — PROTAMINE SULFATE 10 MG/ML IV SOLN
INTRAVENOUS | Status: AC
  Filled 2014-08-02: qty 5

## 2014-08-02 MED ORDER — FENTANYL CITRATE 0.05 MG/ML IJ SOLN
INTRAMUSCULAR | Status: AC
  Filled 2014-08-02: qty 5

## 2014-08-02 MED ORDER — ONDANSETRON HCL 4 MG/2ML IJ SOLN: 4.0000 mg | Freq: Four times a day (QID) | INTRAMUSCULAR | Status: DC | PRN

## 2014-08-02 MED ORDER — PANTOPRAZOLE SODIUM 40 MG PO TBEC
40.0000 mg | DELAYED_RELEASE_TABLET | Freq: Every day | ORAL | Status: DC
  Administered 2014-08-02 – 2014-08-05 (×4): 40 mg via ORAL
  Filled 2014-08-02 (×4): qty 1

## 2014-08-02 MED ORDER — POTASSIUM CHLORIDE CRYS ER 20 MEQ PO TBCR: 20.0000 meq | EXTENDED_RELEASE_TABLET | Freq: Every day | ORAL | Status: DC | PRN

## 2014-08-02 MED ORDER — OXYCODONE HCL 5 MG PO TABS
5.0000 mg | ORAL_TABLET | Freq: Once | ORAL | Status: AC | PRN
  Administered 2014-08-02: 5 mg via ORAL

## 2014-08-02 MED ORDER — ONDANSETRON HCL 4 MG/2ML IJ SOLN
INTRAMUSCULAR | Status: AC
  Filled 2014-08-02: qty 2

## 2014-08-02 MED ORDER — GLYCOPYRROLATE 0.2 MG/ML IJ SOLN
INTRAMUSCULAR | Status: AC
  Filled 2014-08-02: qty 1

## 2014-08-02 MED ORDER — ARTIFICIAL TEARS OP OINT
TOPICAL_OINTMENT | OPHTHALMIC | Status: DC | PRN
  Administered 2014-08-02: 1 via OPHTHALMIC

## 2014-08-02 MED ORDER — PHENOL 1.4 % MT LIQD
1.0000 | OROMUCOSAL | Status: DC | PRN
Start: 2014-08-02 — End: 2014-08-05
  Filled 2014-08-02: qty 177

## 2014-08-02 MED ORDER — SCOPOLAMINE 1 MG/3DAYS TD PT72
MEDICATED_PATCH | TRANSDERMAL | Status: AC
  Administered 2014-08-02: 1 via TRANSDERMAL
  Filled 2014-08-02: qty 1

## 2014-08-02 MED ORDER — DOCUSATE SODIUM 100 MG PO CAPS
100.0000 mg | ORAL_CAPSULE | Freq: Every day | ORAL | Status: DC
  Administered 2014-08-03 – 2014-08-05 (×3): 100 mg via ORAL
  Filled 2014-08-02 (×4): qty 1

## 2014-08-02 MED ORDER — LABETALOL HCL 5 MG/ML IV SOLN
10.0000 mg | INTRAVENOUS | Status: DC | PRN
  Filled 2014-08-02: qty 4

## 2014-08-02 MED ORDER — HYDROMORPHONE HCL 1 MG/ML IJ SOLN
0.2500 mg | INTRAMUSCULAR | Status: DC | PRN
  Administered 2014-08-02 (×4): 0.25 mg via INTRAVENOUS

## 2014-08-02 MED ORDER — MUPIROCIN 2 % EX OINT
TOPICAL_OINTMENT | CUTANEOUS | Status: AC
  Filled 2014-08-02: qty 22

## 2014-08-02 MED ORDER — PHENYLEPHRINE HCL 10 MG/ML IJ SOLN
INTRAMUSCULAR | Status: DC | PRN
  Administered 2014-08-02 (×3): 40 ug via INTRAVENOUS
  Administered 2014-08-02 (×2): 80 ug via INTRAVENOUS

## 2014-08-02 MED ORDER — FENTANYL CITRATE 0.05 MG/ML IJ SOLN
INTRAMUSCULAR | Status: DC | PRN
  Administered 2014-08-02 (×5): 50 ug via INTRAVENOUS

## 2014-08-02 MED ORDER — DEXAMETHASONE SODIUM PHOSPHATE 4 MG/ML IJ SOLN
INTRAMUSCULAR | Status: DC | PRN
  Administered 2014-08-02: 4 mg via INTRAVENOUS

## 2014-08-02 MED ORDER — METOPROLOL TARTRATE 1 MG/ML IV SOLN
2.0000 mg | INTRAVENOUS | Status: DC | PRN
Start: 2014-08-02 — End: 2014-08-05

## 2014-08-02 MED ORDER — MIDAZOLAM HCL 2 MG/2ML IJ SOLN
INTRAMUSCULAR | Status: AC
Start: 2014-08-02 — End: 2014-08-02
  Filled 2014-08-02: qty 2

## 2014-08-02 MED ORDER — ACETAMINOPHEN 325 MG PO TABS: 325.0000 mg | ORAL_TABLET | ORAL | Status: DC | PRN

## 2014-08-02 MED ORDER — SODIUM CHLORIDE 0.9 % IR SOLN
Status: DC | PRN
  Administered 2014-08-02: 09:00:00

## 2014-08-02 MED ORDER — MORPHINE SULFATE 2 MG/ML IJ SOLN
2.0000 mg | INTRAMUSCULAR | Status: DC | PRN
  Administered 2014-08-02 (×2): 2 mg via INTRAVENOUS
  Filled 2014-08-02 (×2): qty 1

## 2014-08-02 MED ORDER — PHENYLEPHRINE 40 MCG/ML (10ML) SYRINGE FOR IV PUSH (FOR BLOOD PRESSURE SUPPORT)
PREFILLED_SYRINGE | INTRAVENOUS | Status: AC
  Filled 2014-08-02: qty 10

## 2014-08-02 MED ORDER — LACTATED RINGERS IV SOLN
INTRAVENOUS | Status: DC | PRN
  Administered 2014-08-02 (×2): via INTRAVENOUS

## 2014-08-02 MED ORDER — SUCCINYLCHOLINE CHLORIDE 20 MG/ML IJ SOLN
INTRAMUSCULAR | Status: AC
  Filled 2014-08-02: qty 1

## 2014-08-02 MED ORDER — DEXAMETHASONE SODIUM PHOSPHATE 4 MG/ML IJ SOLN
INTRAMUSCULAR | Status: AC
  Filled 2014-08-02: qty 1

## 2014-08-02 MED ORDER — PROTAMINE SULFATE 10 MG/ML IV SOLN
INTRAVENOUS | Status: DC | PRN
  Administered 2014-08-02: 20 mg via INTRAVENOUS
  Administered 2014-08-02: 10 mg via INTRAVENOUS
  Administered 2014-08-02: 20 mg via INTRAVENOUS

## 2014-08-02 MED ORDER — HYDRALAZINE HCL 20 MG/ML IJ SOLN: 5.0000 mg | INTRAMUSCULAR | Status: DC | PRN

## 2014-08-02 MED ORDER — SENNOSIDES-DOCUSATE SODIUM 8.6-50 MG PO TABS
1.0000 | ORAL_TABLET | Freq: Every evening | ORAL | Status: DC | PRN
  Filled 2014-08-02: qty 1

## 2014-08-02 MED ORDER — ONDANSETRON HCL 4 MG/2ML IJ SOLN
INTRAMUSCULAR | Status: DC | PRN
  Administered 2014-08-02: 4 mg via INTRAVENOUS

## 2014-08-02 MED ORDER — OXYCODONE HCL 5 MG PO TABS: 5.0000 mg | ORAL_TABLET | Freq: Four times a day (QID) | ORAL | Status: DC | PRN

## 2014-08-02 MED ORDER — ARTIFICIAL TEARS OP OINT
TOPICAL_OINTMENT | OPHTHALMIC | Status: AC
  Filled 2014-08-02: qty 3.5

## 2014-08-02 MED ORDER — ONDANSETRON HCL 4 MG/2ML IJ SOLN
4.0000 mg | Freq: Four times a day (QID) | INTRAMUSCULAR | Status: DC | PRN
  Administered 2014-08-02: 4 mg via INTRAVENOUS
  Filled 2014-08-02: qty 2

## 2014-08-02 MED ORDER — OXYCODONE HCL 5 MG PO TABS
5.0000 mg | ORAL_TABLET | ORAL | Status: DC | PRN
  Administered 2014-08-02 – 2014-08-05 (×10): 5 mg via ORAL
  Filled 2014-08-02 (×10): qty 1

## 2014-08-02 MED ORDER — MUPIROCIN 2 % EX OINT
1.0000 "application " | TOPICAL_OINTMENT | Freq: Once | CUTANEOUS | Status: AC
  Administered 2014-08-02: 1 via TOPICAL

## 2014-08-02 MED ORDER — MIDAZOLAM HCL 5 MG/5ML IJ SOLN
INTRAMUSCULAR | Status: DC | PRN
  Administered 2014-08-02 (×2): 1 mg via INTRAVENOUS

## 2014-08-02 MED ORDER — EPHEDRINE SULFATE 50 MG/ML IJ SOLN
INTRAMUSCULAR | Status: AC
  Filled 2014-08-02: qty 1

## 2014-08-02 MED ORDER — GLYCOPYRROLATE 0.2 MG/ML IJ SOLN
INTRAMUSCULAR | Status: AC
  Filled 2014-08-02: qty 2

## 2014-08-02 MED ORDER — HEPARIN SODIUM (PORCINE) 1000 UNIT/ML IJ SOLN
INTRAMUSCULAR | Status: DC | PRN
  Administered 2014-08-02: 6000 [IU] via INTRAVENOUS

## 2014-08-02 MED ORDER — ROCURONIUM BROMIDE 50 MG/5ML IV SOLN
INTRAVENOUS | Status: AC
  Filled 2014-08-02: qty 1

## 2014-08-02 MED ORDER — PROPOFOL 10 MG/ML IV BOLUS
INTRAVENOUS | Status: DC | PRN
  Administered 2014-08-02: 130 mg via INTRAVENOUS
  Administered 2014-08-02: 30 mg via INTRAVENOUS

## 2014-08-02 MED ORDER — HEPARIN SODIUM (PORCINE) 1000 UNIT/ML IJ SOLN
INTRAMUSCULAR | Status: AC
  Filled 2014-08-02: qty 1

## 2014-08-02 MED ORDER — DEXTROSE 5 % IV SOLN
1.5000 g | Freq: Two times a day (BID) | INTRAVENOUS | Status: AC
  Administered 2014-08-02 – 2014-08-03 (×2): 1.5 g via INTRAVENOUS
  Filled 2014-08-02 (×2): qty 1.5

## 2014-08-02 MED ORDER — OXYCODONE HCL 5 MG PO TABS
ORAL_TABLET | ORAL | Status: AC
  Filled 2014-08-02: qty 1

## 2014-08-02 MED ORDER — ROCURONIUM BROMIDE 100 MG/10ML IV SOLN
INTRAVENOUS | Status: DC | PRN
  Administered 2014-08-02: 35 mg via INTRAVENOUS

## 2014-08-02 MED ORDER — LIDOCAINE HCL (CARDIAC) 20 MG/ML IV SOLN
INTRAVENOUS | Status: DC | PRN
  Administered 2014-08-02: 60 mg via INTRAVENOUS

## 2014-08-02 MED ORDER — HYDROMORPHONE HCL 1 MG/ML IJ SOLN
INTRAMUSCULAR | Status: AC
  Administered 2014-08-02: 0.25 mg via INTRAVENOUS
  Filled 2014-08-02: qty 1

## 2014-08-02 MED ORDER — ENOXAPARIN SODIUM 40 MG/0.4ML ~~LOC~~ SOLN
40.0000 mg | SUBCUTANEOUS | Status: DC
  Administered 2014-08-03 – 2014-08-04 (×2): 40 mg via SUBCUTANEOUS
  Filled 2014-08-02 (×3): qty 0.4

## 2014-08-02 MED ORDER — ALUM & MAG HYDROXIDE-SIMETH 200-200-20 MG/5ML PO SUSP: 15.0000 mL | ORAL | Status: DC | PRN

## 2014-08-02 MED ORDER — SODIUM CHLORIDE 0.9 % IV SOLN: 500.0000 mL | Freq: Once | INTRAVENOUS | Status: AC | PRN

## 2014-08-02 MED ORDER — GUAIFENESIN-DM 100-10 MG/5ML PO SYRP
15.0000 mL | ORAL_SOLUTION | ORAL | Status: DC | PRN
Start: 2014-08-02 — End: 2014-08-05

## 2014-08-02 MED ORDER — ACETAMINOPHEN 650 MG RE SUPP: 325.0000 mg | RECTAL | Status: DC | PRN

## 2014-08-02 MED ORDER — SODIUM CHLORIDE 0.9 % IV SOLN
INTRAVENOUS | Status: DC
  Administered 2014-08-02: 13:00:00 via INTRAVENOUS

## 2014-08-02 MED ORDER — BISACODYL 10 MG RE SUPP
10.0000 mg | Freq: Every day | RECTAL | Status: DC | PRN
Start: 2014-08-02 — End: 2014-08-05

## 2014-08-02 MED ORDER — OXYCODONE HCL 5 MG/5ML PO SOLN: 5.0000 mg | Freq: Once | ORAL | Status: AC | PRN

## 2014-08-02 MED ORDER — 0.9 % SODIUM CHLORIDE (POUR BTL) OPTIME
TOPICAL | Status: DC | PRN
  Administered 2014-08-02: 2000 mL

## 2014-08-02 MED ORDER — NEOSTIGMINE METHYLSULFATE 10 MG/10ML IV SOLN
INTRAVENOUS | Status: DC | PRN
  Administered 2014-08-02: 2 mg via INTRAVENOUS

## 2014-08-02 SURGICAL SUPPLY — 47 items
BANDAGE ESMARK 6X9 LF (GAUZE/BANDAGES/DRESSINGS) IMPLANT
BNDG ESMARK 6X9 LF (GAUZE/BANDAGES/DRESSINGS)
CANISTER SUCTION 2500CC (MISCELLANEOUS) ×3 IMPLANT
CATH EMB 4FR 80CM (CATHETERS) ×3 IMPLANT
CLIP TI MEDIUM 24 (CLIP) ×3 IMPLANT
CLIP TI WIDE RED SMALL 24 (CLIP) ×3 IMPLANT
DECANTER SPIKE VIAL GLASS SM (MISCELLANEOUS) IMPLANT
DRAIN SNY 10X20 3/4 PERF (WOUND CARE) IMPLANT
DRAPE X-RAY CASS 24X20 (DRAPES) IMPLANT
ELECT REM PT RETURN 9FT ADLT (ELECTROSURGICAL) ×3
ELECTRODE REM PT RTRN 9FT ADLT (ELECTROSURGICAL) ×2 IMPLANT
EVACUATOR SILICONE 100CC (DRAIN) IMPLANT
GLOVE BIO SURGEON STRL SZ 6.5 (GLOVE) ×6 IMPLANT
GLOVE BIOGEL PI IND STRL 6.5 (GLOVE) ×4 IMPLANT
GLOVE BIOGEL PI INDICATOR 6.5 (GLOVE) ×2
GLOVE ECLIPSE 6.5 STRL STRAW (GLOVE) ×3 IMPLANT
GLOVE SS BIOGEL STRL SZ 7 (GLOVE) ×4 IMPLANT
GLOVE SUPERSENSE BIOGEL SZ 7 (GLOVE) ×2
GOWN STRL REUS W/ TWL LRG LVL3 (GOWN DISPOSABLE) ×6 IMPLANT
GOWN STRL REUS W/TWL LRG LVL3 (GOWN DISPOSABLE) ×3
INSERT FOGARTY SM (MISCELLANEOUS) ×3 IMPLANT
KIT BASIN OR (CUSTOM PROCEDURE TRAY) ×3 IMPLANT
KIT ROOM TURNOVER OR (KITS) ×3 IMPLANT
LIQUID BAND (GAUZE/BANDAGES/DRESSINGS) ×3 IMPLANT
NS IRRIG 1000ML POUR BTL (IV SOLUTION) ×6 IMPLANT
PACK PERIPHERAL VASCULAR (CUSTOM PROCEDURE TRAY) ×3 IMPLANT
PAD ARMBOARD 7.5X6 YLW CONV (MISCELLANEOUS) ×6 IMPLANT
PADDING CAST COTTON 6X4 STRL (CAST SUPPLIES) IMPLANT
SET COLLECT BLD 21X3/4 12 (NEEDLE) IMPLANT
STOPCOCK 4 WAY LG BORE MALE ST (IV SETS) IMPLANT
SUT PROLENE 5 0 C1 (SUTURE) IMPLANT
SUT PROLENE 6 0 BV (SUTURE) IMPLANT
SUT PROLENE 6 0 C 1 24 (SUTURE) ×6 IMPLANT
SUT PROLENE 6 0 CC (SUTURE) ×6 IMPLANT
SUT PROLENE 7 0 BV1 MDA (SUTURE) IMPLANT
SUT SILK 2 0 SH (SUTURE) ×3 IMPLANT
SUT SILK 3 0 (SUTURE)
SUT SILK 3-0 18XBRD TIE 12 (SUTURE) IMPLANT
SUT VIC AB 2-0 CTX 36 (SUTURE) ×3 IMPLANT
SUT VIC AB 3-0 SH 27 (SUTURE) ×2
SUT VIC AB 3-0 SH 27X BRD (SUTURE) ×4 IMPLANT
SUT VICRYL 4-0 PS2 18IN ABS (SUTURE) ×3 IMPLANT
SYR 3ML LL SCALE MARK (SYRINGE) ×3 IMPLANT
TRAY FOLEY CATH 16FRSI W/METER (SET/KITS/TRAYS/PACK) ×3 IMPLANT
TUBING EXTENTION W/L.L. (IV SETS) IMPLANT
UNDERPAD 30X30 INCONTINENT (UNDERPADS AND DIAPERS) ×3 IMPLANT
WATER STERILE IRR 1000ML POUR (IV SOLUTION) ×3 IMPLANT

## 2014-08-02 NOTE — Progress Notes (Addendum)
Occupational Therapy Evaluation Patient Details Name: Karen CantorCathy F Barnett MRN: [id number] DOB: Nov 28, 1955 Today's Date: 08/02/2014    History of Present Illness 59 y.o. s/p Resection Right popliteal artery aneurysm with Insertion of Reversed Saphenous Vein Graft and Above Knee to Above Knee Popliteal (Right)   Clinical Impression   Pt s/p above. Pt independent with ADLs, PTA. Feel pt will benefit from acute OT to increase independence prior to d/c. Plan to practice LB dressing and tub transfer next session.     Follow Up Recommendations  No OT follow up;Supervision - Intermittent    Equipment Recommendations  3 in 1 bedside comode    Recommendations for Other Services       Precautions / Restrictions Precautions Precautions: Fall Restrictions Weight Bearing Restrictions: No      Mobility Bed Mobility Overal bed mobility: Needs Assistance Bed Mobility: Supine to Sit;Sit to Supine     Supine to sit: Supervision Sit to supine: Supervision   General bed mobility comments: cues for technique to get from supine to sitting position  Transfers Overall transfer level: Needs assistance Equipment used: Rolling walker (2 wheeled) Transfers: Sit to/from Stand Sit to Stand: Min guard         General transfer comment: cues for hand placement.    Balance  Min guard for ambulation with RW.                                          ADL Overall ADL's : Needs assistance/impaired                     Lower Body Dressing: Moderate assistance;Sit to/from stand   Toilet Transfer: Min guard;Ambulation;RW (bed)           Functional mobility during ADLs: Min guard;Rolling walker General ADL Comments: Educated on LB dressing technique and wrote down for pt. Educated on AE. Educated on tub transfer techniques and discussed options for shower chair. Discussed 3 in 1.      Vision     Perception     Praxis      Pertinent Vitals/Pain Pain Assessment:  0-10 Pain Score: 7  Pain Location: RLE Pain Descriptors / Indicators: Aching;Sore Pain Intervention(s): Monitored during session   **BP 103/53 at beginning of session. O2 in 90's on RA.     Hand Dominance     Extremity/Trunk Assessment Upper Extremity Assessment Upper Extremity Assessment: Overall WFL for tasks assessed   Lower Extremity Assessment Lower Extremity Assessment: Defer to PT evaluation       Communication Communication Communication: No difficulties   Cognition Arousal/Alertness: Awake/alert Behavior During Therapy: WFL for tasks assessed/performed Overall Cognitive Status: Within Functional Limits for tasks assessed                     General Comments       Exercises       Shoulder Instructions      Home Living Family/patient expects to be discharged to:: Private residence Living Arrangements: Spouse/significant other;Children Available Help at Discharge: Family (almost 24/7) Type of Home: House Home Access: Stairs to enter Entergy CorporationEntrance Stairs-Number of Steps: 3 Entrance Stairs-Rails: None Home Layout: One level     Bathroom Shower/Tub: Chief Strategy OfficerTub/shower unit   Bathroom Toilet: Standard     Home Equipment:  (may have access to walker and BSC)  Prior Functioning/Environment Level of Independence: Independent             OT Diagnosis: Acute pain   OT Problem List: Decreased range of motion;Decreased activity tolerance;Decreased knowledge of use of DME or AE;Decreased knowledge of precautions;Pain   OT Treatment/Interventions: Self-care/ADL training;DME and/or AE instruction;Therapeutic activities;Balance training;Patient/family education    OT Goals(Current goals can be found in the care plan section) Acute Rehab OT Goals Patient Stated Goal: be independent and walk her dogs; get catheter taken out OT Goal Formulation: With patient Time For Goal Achievement: 08/09/14 Potential to Achieve Goals: Good  OT Frequency: Min  2X/week   Barriers to D/C:            Co-evaluation              End of Session Equipment Utilized During Treatment: Gait belt;Rolling walker; SCDs reapplied Nurse Communication: Mobility status;Other (comment) (pain; wants something to drink and catheter out)  Activity Tolerance: Patient tolerated treatment well Patient left: in bed;with call bell/phone within reach   Time: 1610-9604 OT Time Calculation (min): 17 min Charges:  OT General Charges $OT Visit: 1 Procedure OT Evaluation $Initial OT Evaluation Tier I: 1 Procedure G-CodesEarlie Raveling OTR/L [phone] 08/02/2014, 3:41 PM

## 2014-08-02 NOTE — Anesthesia Postprocedure Evaluation (Signed)
  Anesthesia Post-op Note  Patient: Karen CantorCathy F Reine  Procedure(s) Performed: Procedure(s): Resection Right popliteal artery aneurysm with Insertion of Reversed Saphenous Vein Graft Above Knee to Above Knee Popliteal (Right) Saphenous VEIN HARVEST Right Leg (Right)  Patient Location: PACU  Anesthesia Type:General  Level of Consciousness: awake, alert  and oriented  Airway and Oxygen Therapy: Patient Spontanous Breathing and Patient connected to nasal cannula oxygen  Post-op Pain: mild  Post-op Assessment: Post-op Vital signs reviewed and Patient's Cardiovascular Status Stable  Post-op Vital Signs: Reviewed and stable  Last Vitals:  Filed Vitals:   08/02/14 1107  BP: 123/78  Pulse: 72  Temp: 36.7 C  Resp: 19    Complications: No apparent anesthesia complications

## 2014-08-02 NOTE — Telephone Encounter (Signed)
LM for pt re appt, asked that she call back to confirm, dpm

## 2014-08-02 NOTE — Progress Notes (Signed)
Utilization review completed.  

## 2014-08-02 NOTE — Interval H&P Note (Signed)
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 After consideration of risks, benefits and other options for treatment, the patient has consented to  Procedure(s): BYPASS GRAFT SUPERFICIAL FEMORAL-POPLITEAL ARTERY (Right) as a surgical intervention .  The patient's history has been reviewed, patient examined, no change in status, stable for surgery.  I have reviewed the patient's chart and labs.  Questions were answered to the patient's satisfaction.     Josephina GipLAWSON, Yaa Donnellan

## 2014-08-02 NOTE — Anesthesia Procedure Notes (Signed)
Procedure Name: Intubation Date/Time: 08/02/2014 8:36 AM Performed by: Leonel Ramsay'LAUGHLIN, Abdi Husak H Pre-anesthesia Checklist: Patient identified, Timeout performed, Emergency Drugs available, Suction available and Patient being monitored Patient Re-evaluated:Patient Re-evaluated prior to inductionOxygen Delivery Method: Circle system utilized Preoxygenation: Pre-oxygenation with 100% oxygen Intubation Type: IV induction Ventilation: Mask ventilation without difficulty Laryngoscope Size: Mac and 3 Grade View: Grade I Tube type: Oral Tube size: 7.0 mm Number of attempts: 1 Airway Equipment and Method: Stylet Placement Confirmation: ETT inserted through vocal cords under direct vision,  positive ETCO2 and breath sounds checked- equal and bilateral Secured at: 21 cm Tube secured with: Tape Dental Injury: Teeth and Oropharynx as per pre-operative assessment

## 2014-08-02 NOTE — Op Note (Signed)
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 The right leg was prepped with Betadine scrub and solution draped in routine sterile manner. Medial incision was made above the knee in the distal right thigh carried down through subcutaneous tissue. The popliteal artery was exposed as it exited the abductor canal at that point it was normal in caliber. It was traced distally and a large popliteal aneurysm was encountered which was a proximally 4 cm in diameter and quite pulsatile. It was circumferentially dissected free its branches ligated with 4-0 silk ties and divided. The distal popliteal artery was exposed and it had a good pulse and was of normal caliber as well. This was easily done through a single above-knee incision. Attention turned to the right inguinal area where short longitudinal incision was made great saphenous vein was identified and dissected free up to the saphenofemoral junction. Branches ligated with 3-0 silk ties and divided. About a 8 cm segment was then removed after ligating the vein distally and transecting it. It was gently dilated with heparinized saline marked for orientation purposes it was an excellent vein about 3 mm in size. [name]. Popliteal artery was occluded proximally and distally above the knee. The artery was transected at the origin of the aneurysm aneurysm was then  opened longitudinally and there was a large amount laminated thrombus present in the aneurysm. It was opened longitudinally down into the normal caliber vessel distally and was then resected completely and sent to the lab for evaluation. The distal artery had good backbleeding. A 4 Fogarty catheter was passed down the distal vessel and there was no thrombus retrieved. It was easily flushed with heparin saline. After spatulating the artery both proximally and distally the saphenous vein was used in a reversed fashion and end in anastomoses were done initially distally and then proximally with 6-0 Prolene. Following appropriate flushing is is completed clamps released there was a good pulse in the vein graft and a palpable 3+ dorsalis pedis pulse in the foot. There was also excellent Doppler flow distally. Protamine was given to reverse the heparin following adequate hemostasis wounds irrigated with saline both wounds closed in layers with Vicryl subcuticular fashion with Dermabond patient taken to recovery room in stable condition   Josephina GipJames Tykel Badie, MD 08/02/2014 11:06 AM

## 2014-08-02 NOTE — Progress Notes (Signed)
  [facility] Progress Note  08/02/2014 5:21 PM Day of Surgery  Subjective:  Doing well. Having some pain with incisions.    Filed Vitals:   08/02/14 1236  BP: 96/47  Pulse: 57  Temp: 98.6 F (37 C)  Resp: 17    Physical Exam: Incisions:  Right upper thigh saphenous vein harvest site with mild ecchymosis around incision. No hematoma. Above knee incision clean and intact. Left groin femoral access site without hematoma.  Extremities:  2+ right dorsalis pedis pulse.   CBC    Component Value Date/Time   WBC 10.7* 08/02/2014 1345   RBC 3.91 08/02/2014 1345   HGB 11.7* 08/02/2014 1345   HCT 35.3* 08/02/2014 1345   PLT 210 08/02/2014 1345   MCV 90.3 08/02/2014 1345   MCH 29.9 08/02/2014 1345   MCHC 33.1 08/02/2014 1345   RDW 14.8 08/02/2014 1345   LYMPHSABS 3.3 07/10/2014 1144   MONOABS 0.6 07/10/2014 1144   EOSABS 0.1 07/10/2014 1144   BASOSABS 0.0 07/10/2014 1144    BMET    Component Value Date/Time   NA 140 08/01/2014 1612   K 3.6 08/01/2014 1612   CL 106 08/01/2014 1612   CO2 25 08/01/2014 1612   GLUCOSE 141* 08/01/2014 1612   BUN 7 08/01/2014 1612   CREATININE 0.58 08/02/2014 1345   CREATININE 0.69 07/10/2014 1144   CALCIUM 8.6 08/01/2014 1612   GFRNONAA >90 08/02/2014 1345   GFRNONAA >89 07/10/2014 1144   GFRAA >90 08/02/2014 1345   GFRAA >89 07/10/2014 1144    INR    Component Value Date/Time   INR 1.06 08/01/2014 1612     Intake/Output Summary (Last 24 hours) at 08/02/14 1721 Last data filed at 08/02/14 1440  Gross per 24 hour  Intake   1700 ml  Output    830 ml  Net    870 ml     Assessment:  59 y.o. female is s/p: resection right popliteal artery aneurysm with insertion of ipsilateral reversed saphenous vein graft from  above knee to above knee popliteal   Day of Surgery  Plan: -Incisions healing well. No hematoma. Easily palpable 2+ right dorsalis pedis pulse. -D/C foley tomorrow and mobilize.  -Dispo: home when  pain controlled and ambulating well.    Maris BergerKimberly Torianne Laflam, PA-C [facility] Office: [phone] Pager: [phone] 08/02/2014 5:21 PM

## 2014-08-02 NOTE — Anesthesia Preprocedure Evaluation (Addendum)
Anesthesia Evaluation  Patient identified by MRN, date of birth, ID band Patient awake    Reviewed: Allergy & Precautions, NPO status , Patient's Chart, lab work & pertinent test results  History of Anesthesia Complications (+) PONV  Airway Mallampati: II  TM Distance: >3 FB Neck ROM: full    Dental  (+) Teeth Intact, Dental Advisory Given   Pulmonary former smoker,  breath sounds clear to auscultation        Cardiovascular + Peripheral Vascular Disease Rhythm:regular Rate:Normal  Popliteal aneurysm   Neuro/Psych Anxiety    GI/Hepatic Crohn's dz.   Endo/Other    Renal/GU      Musculoskeletal   Abdominal   Peds  Hematology   Anesthesia Other Findings   Reproductive/Obstetrics                            Anesthesia Physical Anesthesia Plan  ASA: II  Anesthesia Plan: General   Post-op Pain Management:    Induction: Intravenous  Airway Management Planned: Oral ETT  Additional Equipment:   Intra-op Plan:   Post-operative Plan: Extubation in OR  Informed Consent: I have reviewed the patients History and Physical, chart, labs and discussed the procedure including the risks, benefits and alternatives for the proposed anesthesia with the patient or authorized representative who has indicated his/her understanding and acceptance.   Dental advisory given  Plan Discussed with: CRNA, Anesthesiologist and Surgeon  Anesthesia Plan Comments:        Anesthesia Quick Evaluation

## 2014-08-02 NOTE — Transfer of Care (Signed)
Immediate Anesthesia Transfer of Care Note  Patient: Karen CantorCathy F Frisch  Procedure(s) Performed: Procedure(s): Resection Right popliteal artery aneurysm with Insertion of Reversed Saphenous Vein Graft Above Knee to Above Knee Popliteal (Right) Saphenous VEIN HARVEST Right Leg (Right)  Patient Location: PACU  Anesthesia Type:General  Level of Consciousness: awake, alert  and oriented  Airway & Oxygen Therapy: Patient Spontanous Breathing and Patient connected to nasal cannula oxygen  Post-op Assessment: Report given to RN and Post -op Vital signs reviewed and stable  Post vital signs: Reviewed and stable  Last Vitals:  Filed Vitals:   08/02/14 0708  BP: 103/45  Pulse: 59  Temp: 36.6 C  Resp: 16    Complications: No apparent anesthesia complications

## 2014-08-02 NOTE — Telephone Encounter (Signed)
-----   Message from Phillips Odorarol S Pullins, RN sent at 08/02/2014 11:23 AM EDT ----- Regarding: 2 wk f/u / JDL   ----- Message -----    From: Dara LordsSamantha J Rhyne, PA-C    Sent: 08/02/2014  10:53 AM      To: [facility] Charge Pool  S/p left pop to pop bypass graft with saphenous vein 08/02/14.  F/u with Dr. Hart RochesterLawson in 2 weeks.  Thanks, Lelon MastSamantha

## 2014-08-02 NOTE — H&P (View-Only) (Signed)
Subjective:     Patient ID: Karen Barnett, female   DOB: 05/06/1955, 58 y.o.   MRN: [id number]  HPI this 58-year-old female was referred for evaluation of her right popliteal artery aneurysm. She has been having pain behind the right knee joint for the past 12 months which has increased in intensity. She has no history of trauma to this area. She denies a history of abdominal aortic aneurysm or a family history of abdominal aortic aneurysm. She does have a grandfather had a cerebral aneurysm. 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 She has no history of ischemia of the right foot. She denies claudication in the right calf able to ambulate long distances. She has a history of smoking but does not currently smoke.  Past Medical History  Diagnosis Date  . Crohn's colitis   . Crohn's disease     History  Substance Use Topics  . Smoking status: Former Smoker    Quit date: 02/14/2009  . Smokeless tobacco: Never Used  . Alcohol Use: 2.4 oz/week    4 Shots of liquor per week    Family History  Problem Relation Age of Onset  . Diabetes Father   . Diabetes Brother     No Known Allergies   Current outpatient prescriptions:  .  cyanocobalamin (,VITAMIN B-12,) 1000 MCG/ML injection, INJECT 1ML EVERY MONTH, Disp: 10 mL, Rfl: 0  Filed Vitals:   07/25/14 1337  BP: 129/84  Pulse: 79  Resp: 16  Height: 4' 11" (1.499 m)  Weight: 108 lb (48.988 kg)    Body mass index is 21.8 kg/(m^2).           Review of Systems denies chest pain, dyspnea on exertion, PND, orthopnea    does have a history of Crohn's colitis.Other systems negative and a complete review of systems  Objective:   Physical Exam BP 129/84 mmHg  Pulse 79  Resp 16  Ht 4' 11" (1.499 m)  Wt 108 lb (48.988 kg)  BMI 21.80 kg/m2  Gen.-alert and oriented x3 in no apparent distress HEENT normal for age Lungs no rhonchi or wheezing Cardiovascular regular rhythm no murmurs carotid  pulses 3+ palpable no bruits audible Abdomen soft nontender no palpable masses Musculoskeletal free of  major deformities Skin clear -no rashes Neurologic normal Lower extremities 3+ femoral and 2+ dorsalis pedis and posterior tibial pulses palpable bilaterally with no edema 4 cm pulsatile mass in right popliteal fossa. No popliteal mass noted on the left side.  Today I reviewed the report of the lower extremity scan performed at another vascular lab last week. 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      

## 2014-08-03 ENCOUNTER — Encounter (HOSPITAL_COMMUNITY): Payer: Self-pay | Admitting: Vascular Surgery

## 2014-08-03 LAB — BASIC METABOLIC PANEL
ANION GAP: 9 (ref 5–15)
BUN: <5 mg/dL — ABNORMAL LOW (ref 6–23)
CALCIUM: 8.8 mg/dL (ref 8.4–10.5)
CO2: 25 mmol/L (ref 19–32)
Chloride: 107 mmol/L (ref 96–112)
Creatinine, Ser: 0.71 mg/dL (ref 0.50–1.10)
GFR calc Af Amer: >90 mL/min (ref >90)
GFR calc non Af Amer: >90 mL/min (ref >90)
Glucose, Bld: 103 mg/dL — ABNORMAL HIGH (ref 70–99)
Potassium: 4 mmol/L (ref 3.5–5.1)
SODIUM: 141 mmol/L (ref 135–145)

## 2014-08-03 LAB — CBC
HCT: 33.6 % — ABNORMAL LOW (ref 36.0–46.0)
Hemoglobin: 11.1 g/dL — ABNORMAL LOW (ref 12.0–15.0)
MCH: 29.7 pg (ref 26.0–34.0)
MCHC: 33 g/dL (ref 30.0–36.0)
MCV: 89.8 fL (ref 78.0–100.0)
Platelets: 236 10*3/uL (ref 150–400)
RBC: 3.74 MIL/uL — ABNORMAL LOW (ref 3.87–5.11)
RDW: 14.8 % (ref 11.5–15.5)
WBC: 11.3 10*3/uL — ABNORMAL HIGH (ref 4.0–10.5)

## 2014-08-03 NOTE — Progress Notes (Signed)
Occupational Therapy Treatment Patient Details Name: Karen CantorCathy F Barnett MRN: [id number] DOB: Nov 06, 1955 Today's Date: 08/03/2014    History of present illness 59 y.o. s/p Resection Right popliteal artery aneurysm with Insertion of Reversed Saphenous Vein Graft and Above Knee to Above Knee Popliteal (Right)   OT comments  Pt requiring min assist for LB dressing, will rely on family to assist as needed. Able to step over edge of tub with min guard assist.  Plans to use mother's 3 in 1 as tub seat. Recommended long sponge. Pain limiting.  Follow Up Recommendations  No OT follow up;Supervision - Intermittent    Equipment Recommendations  None recommended by OT    Recommendations for Other Services      Precautions / Restrictions Precautions Precautions: Fall Precaution Comments: pt. with tendance to keep right knee in flexed position for comfort.  Educated pt. on importance of maintianing full knee extension Restrictions Weight Bearing Restrictions: No Other Position/Activity Restrictions: difficulty putting full weight on R LE       Mobility Bed Mobility      General bed mobility comments: pt up in chair  Transfers Overall transfer level: Needs assistance Equipment used: Rolling walker (2 wheeled) Transfers: Sit to/from Stand Sit to Stand: Supervision         General transfer comment: no physical assist, safety only    Balance Overall balance assessment: Needs assistance Sitting-balance support: No upper extremity supported;Feet supported Sitting balance-Leahy Scale: Good Sitting balance - Comments: able to pull own socks up   Standing balance support: Bilateral upper extremity supported;During functional activity Standing balance-Leahy Scale: Poor Standing balance comment: needs support of RW for ambulation due to soreness in right LE                   ADL Overall ADL's : Needs assistance/impaired     Grooming: Supervision/safety         Lower Body  Bathing Details (indicate cue type and reason): instructed in benefits of long handled bath sponge     Lower Body Dressing: Minimal assistance;Sit to/from stand Lower Body Dressing Details (indicate cue type and reason): able to pull up sock on R and donn L independently, pt is aware of AE for LB ADL. Toilet Transfer: Supervision/safety;Ambulation;BSC   Toileting- ArchitectClothing Manipulation and Hygiene: Supervision/safety;Sit to/from stand   Tub/ Shower Transfer: Min guard;Ambulation;3 in 1 Tub/Shower Transfer Details (indicate cue type and reason): simulated over garbage can Functional mobility during ADLs: Supervision/safety;Rolling walker General ADL Comments: Pt has a garden tub. Has access to her mother's 3 in1 to use as a shower seat.      Vision                     Perception     Praxis      Cognition   Behavior During Therapy: WFL for tasks assessed/performed Overall Cognitive Status: Within Functional Limits for tasks assessed                       Extremity/Trunk Assessment     Cervical / Trunk Assessment Cervical / Trunk Assessment: Normal    Exercises    Shoulder Instructions       General Comments      Pertinent Vitals/ Pain       Pain Assessment: Faces Pain Score: 10 Faces Pain Scale: Hurts whole lot Pain Location: R LE Pain Descriptors / Indicators: Grimacing;Operative site guarding Pain Intervention(s): Patient requesting pain meds-RN notified;Repositioned;Limited  activity within patient's tolerance;Monitored during session  Home Living Family/patient expects to be discharged to:: Private residence Living Arrangements: Spouse/significant other;Children (kids 19 and 15) Available Help at Discharge: Family Type of Home: House Home Access: Stairs to enter Secretary/administrator of Steps: 3 Entrance Stairs-Rails: None Home Layout: One level               Home Equipment: Other (comment) (may have access to RW and BSC)           Prior Functioning/Environment Level of Independence: Independent        Comments: owns a pet sitting business   Frequency Min 2X/week     Progress Toward Goals  OT Goals(current goals can now be found in the care plan section)  Progress towards OT goals: Progressing toward goals  Acute Rehab OT Goals 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 Time For Goal Achievement: 08/09/14 Potential to Achieve Goals: Good  Plan Discharge plan remains appropriate    Co-evaluation                 End of Session Equipment Utilized During Treatment: Rolling walker   Activity Tolerance Patient tolerated treatment well   Patient Left in chair;with call bell/phone within reach;with family/visitor present   Nurse Communication Patient requests pain meds        Time: 1610-9604 OT Time Calculation (min): 18 min  Charges: OT General Charges $OT Visit: 1 Procedure OT Treatments $Self Care/Home Management : 8-22 mins  Evern Bio 08/03/2014, 10:41 AM  [phone]

## 2014-08-03 NOTE — Progress Notes (Addendum)
  [facility] Progress Note  08/03/2014 7:35 AM 1 Day Post-Op  Subjective:  Having some soreness with incisions. Otherwise no complaints.   Filed Vitals:   08/03/14 0505  BP: 112/61  Pulse: 75  Temp: 99.2 F (37.3 C)  Resp: 16    Physical Exam: Incisions:  Right groin saphenectomy site clean and intact with minor ecchymosis around incision. No hematoma. Right above knee incision healing well. Left groin without hematoma. Puncture site healed.  Extremities:  2-3+ right dorsalis pedis pulse.   CBC    Component Value Date/Time   WBC 11.3* 08/03/2014 0414   RBC 3.74* 08/03/2014 0414   HGB 11.1* 08/03/2014 0414   HCT 33.6* 08/03/2014 0414   PLT 236 08/03/2014 0414   MCV 89.8 08/03/2014 0414   MCH 29.7 08/03/2014 0414   MCHC 33.0 08/03/2014 0414   RDW 14.8 08/03/2014 0414   LYMPHSABS 3.3 07/10/2014 1144   MONOABS 0.6 07/10/2014 1144   EOSABS 0.1 07/10/2014 1144   BASOSABS 0.0 07/10/2014 1144    BMET    Component Value Date/Time   NA 141 08/03/2014 0414   K 4.0 08/03/2014 0414   CL 107 08/03/2014 0414   CO2 25 08/03/2014 0414   GLUCOSE 103* 08/03/2014 0414   BUN <5* 08/03/2014 0414   CREATININE 0.71 08/03/2014 0414   CREATININE 0.69 07/10/2014 1144   CALCIUM 8.8 08/03/2014 0414   GFRNONAA >90 08/03/2014 0414   GFRNONAA >89 07/10/2014 1144   GFRAA >90 08/03/2014 0414   GFRAA >89 07/10/2014 1144    INR    Component Value Date/Time   INR 1.06 08/01/2014 1612     Intake/Output Summary (Last 24 hours) at 08/03/14 0735 Last data filed at 08/03/14 0536  Gross per 24 hour  Intake   1700 ml  Output   2355 ml  Net   -655 ml     Assessment:  59 y.o. female is s/p: resection right popliteal artery aneurysm with insertion of ipsilateral reversed saphenous vein graft fromabove knee to above knee popliteal  1 Day Post-Op  Plan: -Doing well post-operatively. Incisions are fine. 2-3+ right DP pulse. -Ambulate today. -Dispo: patient wants to  stay to work on ambulation. Anticipate discharge in next 1-2 days. -DVT prophylaxis:  Lovenox   Maris BergerKimberly Trinh, PA-C [facility] Office: [phone] Pager: [phone] 08/03/2014 7:35 AM  3+ DP and PT pulse right foot Right thigh incision and inguinal incision healing nicely Complains of mild tingling in the peripatellar area  Will ambulate today DC Foley and DC home in a.m. if ambulating well

## 2014-08-03 NOTE — Evaluation (Signed)
Physical Therapy Evaluation Patient Details Name: Karen CantorCathy F Redner MRN: [id number] DOB: 01-31-1956 Today's Date: 08/03/2014   History of Present Illness  59 y.o. s/p Resection Right popliteal artery aneurysm with Insertion of Reversed Saphenous Vein Graft and Above Knee to Above Knee Popliteal (Right)  Clinical Impression  Pt. Presents to PT with a tendancy to keep right knee flexed when at rest , as well as decreased independence in functional mobility and gait.  Will benefit from acute PT to address these and below areas for progression to independence prior to Dc home, likely tomorrow.      Follow Up Recommendations No PT follow up;Supervision/Assistance - 24 hour    Equipment Recommendations  Rolling walker with 5" wheels;Other (comment) (if pt. not able to borrow; she will check on this)    Recommendations for Other Services       Precautions / Restrictions Precautions Precautions: Fall Precaution Comments: pt. with tendance to keep right knee in flexed position for comfort.  Educated pt. on importance of maintianing full knee extension Restrictions Weight Bearing Restrictions: No      Mobility  Bed Mobility Overal bed mobility: Modified Independent Bed Mobility: Supine to Sit     Supine to sit: Modified independent (Device/Increase time)     General bed mobility comments: use of bedrail to assist self  Transfers Overall transfer level: Needs assistance Equipment used: Rolling walker (2 wheeled) Transfers: Sit to/from Stand Sit to Stand: Supervision         General transfer comment: cues for correct hand placement in rising to stand and sitting down   Ambulation/Gait Ambulation/Gait assistance: Supervision Ambulation Distance (Feet): 60 Feet Assistive device: Rolling walker (2 wheeled) Gait Pattern/deviations: Step-to pattern;Decreased weight shift to right Gait velocity: decreased   General Gait Details: Encouraged pt. to normalize gait pattern by  increasing WB on R LE .  Pt. needing supervision for safety and assist for managing IV pole only.  Discussed with pt. and her daughter that she appears safe enough to ambulate later in day with family managing IV pole.  Pt. and daughter agreeable that this seems reasonable for them to do later today.  I instructed daughter in IV cord management  Stairs            Wheelchair Mobility    Modified Rankin (Stroke Patients Only)       Balance Overall balance assessment: Needs assistance Sitting-balance support: No upper extremity supported;Feet supported Sitting balance-Leahy Scale: Good Sitting balance - Comments: able to pull own socks up   Standing balance support: Bilateral upper extremity supported;During functional activity Standing balance-Leahy Scale: Poor Standing balance comment: needs support of RW for ambulation due to soreness in right LE                             Pertinent Vitals/Pain Pain Assessment: 0-10 Pain Score: 5  Pain Location: right knee surgical site Pain Descriptors / Indicators: Aching;Sore Pain Intervention(s): Monitored during session;Premedicated before session;Repositioned;Limited activity within patient's tolerance    Home Living Family/patient expects to be discharged to:: Private residence Living Arrangements: Spouse/significant other;Children (kids 19 and 15) Available Help at Discharge: Family Type of Home: House Home Access: Stairs to enter Entrance Stairs-Rails: None Entrance Stairs-Number of Steps: 3 Home Layout: One level Home Equipment: Other (comment) (may have access to RW and BSC)      Prior Function Level of Independence: Independent         Comments:  owns a pet sitting business     Hand Dominance   Dominant Hand: Right    Extremity/Trunk Assessment   Upper Extremity Assessment: Overall WFL for tasks assessed           Lower Extremity Assessment: RLE deficits/detail RLE Deficits / Details: keeps  R LE positioned in hip external rotation and knee flexion for comfort; with cueing , pt able to fully straighten knee and achieve neutral hip rotation.  Strength WFL  for post op    Cervical / Trunk Assessment: Normal  Communication   Communication: No difficulties  Cognition Arousal/Alertness: Awake/alert Behavior During Therapy: WFL for tasks assessed/performed Overall Cognitive Status: Within Functional Limits for tasks assessed                      General Comments General comments (skin integrity, edema, etc.): both incisions are clean and dry , open to air    Exercises General Exercises - Lower Extremity Ankle Circles/Pumps: AROM;Both;10 reps Quad Sets: AROM;Both;10 reps      Assessment/Plan    PT Assessment Patient needs continued PT services  PT Diagnosis Difficulty walking;Acute pain   PT Problem List Decreased strength;Decreased activity tolerance;Decreased balance;Decreased mobility;Decreased knowledge of use of DME;Decreased knowledge of precautions;Pain  PT Treatment Interventions DME instruction;Gait training;Stair training;Functional mobility training;Therapeutic exercise;Balance training;Patient/family education   PT Goals (Current goals can be found in the Care Plan section) Acute Rehab PT Goals 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 PT Goal Formulation: With patient Time For Goal Achievement: 08/10/14 Potential to Achieve Goals: Good    Frequency Min 4X/week   Barriers to discharge        Co-evaluation               End of Session Equipment Utilized During Treatment: Gait belt Activity Tolerance: Patient tolerated treatment well Patient left: in chair;with call bell/phone within reach;with family/visitor present Nurse Communication: Mobility status         Time: 1610-9604 PT Time Calculation (min) (ACUTE ONLY): 29 min   Charges:   PT Evaluation $Initial PT Evaluation Tier I: 1 Procedure PT Treatments $Gait  Training: 8-22 mins   PT G CodesFerman Hamming 08/03/2014, 10:09 AM Weldon Picking PT Acute Rehab Services [phone] Beeper [phone]

## 2014-08-04 MED ORDER — ATORVASTATIN CALCIUM 10 MG PO TABS: 10.0000 mg | ORAL_TABLET | Freq: Every day | ORAL | Status: DC

## 2014-08-04 MED ORDER — ASPIRIN EC 81 MG PO TBEC: 81.0000 mg | DELAYED_RELEASE_TABLET | Freq: Every day | ORAL | Status: AC

## 2014-08-04 NOTE — Discharge Summary (Signed)
Discharge Summary     Karen Barnett [birth year] 59 y.o. 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 female was referred for evaluation of her right popliteal artery aneurysm. She has been having pain behind the right knee joint for the past 12 months which has increased in intensity. She has no history of trauma to this area. She denies a history of abdominal aortic aneurysm or a family history of abdominal aortic aneurysm. She does have a grandfather had a cerebral aneurysm. 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 She has no history of ischemia of the right foot. She denies claudication in the right calf able to ambulate long distances. She has a history of smoking but does not currently smoke.  Hospital Course:  The patient was admitted to the hospital and taken to the operating room on 08/02/2014 and underwent: Right popliteal artery aneurysm with Insertion of Reversed Saphenous Vein Graft Above Knee to Above Knee Popliteal and saphenous vein harvest right leg.  The pt tolerated the procedure well and was transported to the PACU in good condition.   By POD 1, she was doing well.  She has a palpable right DP/PT pulse. By POD 2, she continues to do well.  She has mobilized and sat in the chair.  She continues to have palpable pedal pulses in the right foot.  States her pre operative pain is resolved.  She is started on a baby aspirin and Lipitor daily.  By POD 3, she is discharged home.  She had a 2-3+ popliteal and PT pulse.  The remainder of the hospital course consisted of increasing mobilization and increasing intake of solids without difficulty.  CBC    Component Value Date/Time   WBC 11.3* 08/03/2014 0414   RBC 3.74* 08/03/2014 0414   HGB 11.1* 08/03/2014 0414   HCT 33.6*  08/03/2014 0414   PLT 236 08/03/2014 0414   MCV 89.8 08/03/2014 0414   MCH 29.7 08/03/2014 0414   MCHC 33.0 08/03/2014 0414   RDW 14.8 08/03/2014 0414   LYMPHSABS 3.3 07/10/2014 1144   MONOABS 0.6 07/10/2014 1144   EOSABS 0.1 07/10/2014 1144   BASOSABS 0.0 07/10/2014 1144    BMET    Component Value Date/Time   NA 141 08/03/2014 0414   K 4.0 08/03/2014 0414   CL 107 08/03/2014 0414   CO2 25 08/03/2014 0414   GLUCOSE 103* 08/03/2014 0414   BUN <5* 08/03/2014 0414   CREATININE 0.71 08/03/2014 0414   CREATININE 0.69 07/10/2014 1144   CALCIUM 8.8 08/03/2014 0414   GFRNONAA >90 08/03/2014 0414   GFRNONAA >89 07/10/2014 1144   GFRAA >90 08/03/2014 0414   GFRAA >89 07/10/2014 1144           Discharge Instructions    Call MD for:  redness, tenderness, or signs of infection (pain, swelling, bleeding, redness, odor or green/yellow discharge around incision site)    Complete by:  As directed      Call MD for:  severe or increased pain, loss or decreased feeling  in affected limb(s)    Complete by:  As directed      Call MD for:  temperature >100.5    Complete by:  As directed      Discharge wound care:    Complete by:  As  directed   Wash the groin wound with soap and water daily and pat dry. (No tub bath-only shower)  Then put a dry gauze or washcloth there to keep this area dry daily and as needed.  Do not use Vaseline or neosporin on your incisions.  Only use soap and water on your incisions and then protect and keep dry.     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 Popliteal aneurysm 08/02/2014  . Crohn's colitis    Past Medical History  Diagnosis  Date  . Crohn's colitis   . Crohn's disease   . Popliteal aneurysm     Right  . Anxiety     situational       Medication List    TAKE these medications        aspirin EC 81 MG tablet  Take 1 tablet (81 mg total) by mouth daily.     atorvastatin 10 MG tablet  Commonly known as:  LIPITOR  Take 1 tablet (10 mg total) by mouth daily.     cyanocobalamin 1000 MCG/ML injection  Commonly known as:  (VITAMIN B-12)  INJECT 1ML EVERY MONTH     ibuprofen 200 MG tablet  Commonly known as:  ADVIL,MOTRIN  Take 200-400 mg by mouth every 6 (six) hours as needed.     oxyCODONE 5 MG immediate release tablet  Commonly known as:  ROXICODONE  Take 1 tablet (5 mg total) by mouth every 6 (six) hours as needed.        Prescriptions given: 1.  Roxicodone #30 No Refill  Instructions: 1.  Wash the groin wound with soap and water daily and pat dry. (No tub bath-only shower)  Then put a dry gauze or washcloth there to keep this area dry daily and as needed.  Do not use Vaseline or neosporin on your incisions.  Only use soap and water on your incisions and then protect and keep dry.  Disposition: home  Patient's condition: is Good  Follow up: 1. Dr. Hart RochesterLawson in 2 weeks   Doreatha MassedSamantha Ebonique Hallstrom, PA-C [facility] [phone] 08/04/2014  10:55 AM  - For VQI Registry use --- Instructions: Press F2 to tab through selections.  Delete question if not applicable.   Post-op:  Wound infection: No  Graft infection: No  Transfusion: No  If yes, n/a units given New Arrhythmia: No Ipsilateral amputation: No, [ ]  Minor, [ ]  BKA, [ ]  AKA Discharge patency: [x ] Primary, [ ]  Primary assisted, [ ]  Secondary, [ ]  Occluded Patency judged by: [ ]  Dopper only, [ ]  Palpable graft pulse, [x ] Palpable distal pulse, [ ]  ABI inc. > 0.15, [ ]  Duplex Discharge ABI: R not done, L  D/C Ambulatory Status: Ambulatory  Complications: MI: No, [ ]  Troponin only, [ ]  EKG or Clinical CHF: No Resp  failure:No, [ ]  Pneumonia, [ ]  Ventilator Chg in renal function: No, [ ]  Inc. Cr > 0.5, [ ]  Temp. Dialysis, [ ]  Permanent dialysis Stroke: No, [ ]  Minor, [ ]  Major  Return to OR: No  Reason for return to OR: [ ]  Bleeding, [ ]  Infection, [ ]  Thrombosis, [ ]  Revision  Discharge medications: Statin use:  yes ASA use:  yes Plavix use:  no Beta blocker use: no Coumadin use: no

## 2014-08-04 NOTE — Progress Notes (Addendum)
  Progress Note    08/04/2014 8:01 AM 2 Days Post-Op  Subjective:  C/o of mild pain-waiting for breakfast before taking pain med.  Afebrile VSS 98% RA  Filed Vitals:   08/04/14 0432  BP: 104/58  Pulse: 83  Temp: 99 F (37.2 C)  Resp: 18    Physical Exam: Cardiac:  regular Lungs:  Non labored Incisions:  C/d/i Extremities:  2+ right DP/PT palpable pulses   CBC    Component Value Date/Time   WBC 11.3* 08/03/2014 0414   RBC 3.74* 08/03/2014 0414   HGB 11.1* 08/03/2014 0414   HCT 33.6* 08/03/2014 0414   PLT 236 08/03/2014 0414   MCV 89.8 08/03/2014 0414   MCH 29.7 08/03/2014 0414   MCHC 33.0 08/03/2014 0414   RDW 14.8 08/03/2014 0414   LYMPHSABS 3.3 07/10/2014 1144   MONOABS 0.6 07/10/2014 1144   EOSABS 0.1 07/10/2014 1144   BASOSABS 0.0 07/10/2014 1144    BMET    Component Value Date/Time   NA 141 08/03/2014 0414   K 4.0 08/03/2014 0414   CL 107 08/03/2014 0414   CO2 25 08/03/2014 0414   GLUCOSE 103* 08/03/2014 0414   BUN <5* 08/03/2014 0414   CREATININE 0.71 08/03/2014 0414   CREATININE 0.69 07/10/2014 1144   CALCIUM 8.8 08/03/2014 0414   GFRNONAA >90 08/03/2014 0414   GFRNONAA >89 07/10/2014 1144   GFRAA >90 08/03/2014 0414   GFRAA >89 07/10/2014 1144    INR    Component Value Date/Time   INR 1.06 08/01/2014 1612     Intake/Output Summary (Last 24 hours) at 08/04/14 0801 Last data filed at 08/03/14 1900  Gross per 24 hour  Intake    360 ml  Output   2400 ml  Net  -2040 ml     Assessment:  59 y.o. female is s/p:  resection right popliteal artery aneurysm with insertion of ipsilateral reversed saphenous vein graft fromabove knee to above knee popliteal   2 Days Post-Op  Plan: -pt doing well this am -she has ambulated, but still having some pain -may discharge today or tomorrow-will see how the day progresses -discussed groin wound care with pt and she expressed understanding -encouraged her to take a stool softener with her pain  medication to help with constipation. -DVT prophylaxis:  Lovenox   Doreatha MassedSamantha Rhyne, PA-C [facility] [phone] 08/04/2014 8:01 AM  Right thigh wound and inguinal wound healing nicely. 3+ dorsalis pedis posterior tibial pulses palpable. Right foot well-perfused. Patient improving her ambulation. Possibly DC later today or in a.m.

## 2014-08-04 NOTE — Progress Notes (Signed)
Pt c/o of scant drainage on lower portion of Right leg incision. Nurse assessed small amount of redness near the bottom of incision. Edges intact and no other drainage noted. VSS  Will continue to monitor.   Edgardo RoysMcGrath, Hanny Elsberry R

## 2014-08-04 NOTE — Progress Notes (Signed)
Physical Therapy Treatment And Discharge Patient Details Name: Karen Barnett MRN: [id number] DOB: 07-11-1955 Today's Date: 08/04/2014    History of Present Illness 59 y.o. s/p Resection Right popliteal artery aneurysm with Insertion of Reversed Saphenous Vein Graft and Above Knee to Above Knee Popliteal (Right)    PT Comments    Excellent progress today with PT.  She has full knee extension and good resting positioning.  She is mod I with RW and min assist for negotiating steps with hand hold assist and no rails.  PT goals met.  I have [place] her from PT services and encouraged her to walk in hallway as she can tolerate.    Follow Up Recommendations  No PT follow up;Supervision/Assistance - 24 hour     Equipment Recommendations  Rolling walker with 5" wheels    Recommendations for Other Services       Precautions / Restrictions Precautions Precautions: Fall Restrictions Weight Bearing Restrictions: No Other Position/Activity Restrictions: pt. able to bear more weight on right LE today, right LE in full knee extension upon PT arrival today    Mobility  Bed Mobility Overal bed mobility:  (not tested, pt. up in recliner)                Transfers Overall transfer level: Needs assistance Equipment used: Rolling walker (2 wheeled) Transfers: Sit to/from Stand Sit to Stand: Supervision         General transfer comment: verbal reminder for correct hand placement , otherwise at mod I level  Ambulation/Gait Ambulation/Gait assistance: Modified independent (Device/Increase time) Ambulation Distance (Feet): 150 Feet Assistive device: Rolling walker (2 wheeled) Gait Pattern/deviations: Step-to pattern;Step-through pattern     General Gait Details: Emerging step through, pt. albe to tolerate near full weight bearing on right LE today; good sequence and technique   Stairs Stairs: Yes Stairs assistance: Min assist Stair Management: No rails;Forwards Number of Stairs:  4 General stair comments: Pt. supported on right side by therapist.  Pt. was taught correct sequence and technique of hand hold assist on right side.  Pt. says her husband will be assisting her at home  and son is available if she feels like she needs two person assist.  Wheelchair Mobility    Modified Rankin (Stroke Patients Only)       Balance                                    Cognition Arousal/Alertness: Awake/alert Behavior During Therapy: WFL for tasks assessed/performed Overall Cognitive Status: Within Functional Limits for tasks assessed                      Exercises      General Comments        Pertinent Vitals/Pain Pain Assessment: 0-10 Pain Score: 4  Pain Location: right knee surgicla incision; not complaining of groin pain this am Pain Descriptors / Indicators: Aching;Discomfort Pain Intervention(s): Monitored during session;Repositioned;Limited activity within patient's tolerance;Premedicated before session    Home Living                      Prior Function            PT Goals (current goals can now be found in the care plan section) Progress towards PT goals: Goals met/education completed, patient discharged from PT    Frequency  Other (Comment)    PT  Plan Current plan remains appropriate    Co-evaluation             End of Session Equipment Utilized During Treatment: Gait belt Activity Tolerance: Patient tolerated treatment well Patient left: in chair;with call bell/phone within reach     Time: 1000-1023 PT Time Calculation (min) (ACUTE ONLY): 23 min  Charges:  $Gait Training: 23-37 mins                    G Codes:      Ladona Ridgel 08/04/2014, 10:32 AM Gerlean Ren PT Acute Rehab Services [phone] Beeper [phone]

## 2014-08-05 NOTE — Care Management Note (Signed)
    Page 1 of 1   08/05/2014     12:03:44 PM CARE MANAGEMENT NOTE 08/05/2014  Patient:  Calvert CantorMILLER,Laurena F   Account Number:  [id number]  Date Initiated:  08/05/2014  Documentation initiated by:  [facility],Konstantin Lehnen  Subjective/Objective Assessment:   adm: Resection Right popliteal artery aneurysm with Insertion of Reversed Saphenous Vein Graft and Above Knee to Above Knee Popliteal     Action/Plan:   discharge planning   Anticipated DC Date:  08/05/2014   Anticipated DC Plan:  HOME/SELF CARE      DC Planning Services  CM consult      Choice offered to / List presented to:     DME arranged  [facility]      DME agency  [facility]        Status of service:  Completed, signed off Medicare Important Message given?   (If response is "NO", the following Medicare IM given date fields will be blank) Date Medicare IM given:   Medicare IM given by:   Date Additional Medicare IM given:   Additional Medicare IM given by:    Discharge Disposition:  HOME/SELF CARE  Per UR Regulation:    If discussed at Long Length of Stay Meetings, dates discussed:    Comments:  08/05/14 12;00 Cm received call from RN requesting arrangement for a rolling walker.  Cm called [facility] DME rep, Fayrene FearingJames to please deliver to room so pt can be discharged. No HHPT ordered or recc.  NO other CM needs were communicated.  Jerrol BananaSarah Jeffrie,s BSN, CM [phone].

## 2014-08-05 NOTE — Progress Notes (Signed)
Patient ID: Karen CantorCathy F Ralph, female   DOB: 31-Dec-1955, 59 y.o.   MRN: [id number] Comfortable. Ready for DC today. Sessions healing quite nicely and she has a 2-3+ popliteal and posterior tibial pulse. Follow-up with Dr. Hart RochesterLawson in 2-3 weeks

## 2014-08-05 NOTE — Progress Notes (Signed)
08/05/2014 1:52 PM Discharge AVS meds taken today and those due this evening reviewed.  Follow-up appointments and when to call md reviewed.  D/C IV and TELE.  Questions and concerns addressed.   D/C home per orders. Kathryne HitchAllen, Nyssa Sayegh C

## 2014-08-05 NOTE — Progress Notes (Signed)
VASCULAR LAB PRELIMINARY  ARTERIAL  ABI completed:    RIGHT    LEFT    PRESSURE WAVEFORM  PRESSURE WAVEFORM  BRACHIAL 125 triphasic BRACHIAL 123 triphasic  DP   DP    AT 127 triphasic AT 136 triphasic  PT 140 triphasic PT 135 triphasic  PER   PER    GREAT TOE  NA GREAT TOE  NA    RIGHT LEFT  ABI >1.0 >1.0     Estefana Taylor, RVT 08/05/2014, 10:49 AM

## 2014-08-07 NOTE — Telephone Encounter (Signed)
Spoke with patient to confirm, dpm  °

## 2014-08-14 ENCOUNTER — Encounter: Payer: Self-pay | Admitting: Vascular Surgery

## 2014-08-15 ENCOUNTER — Encounter: Payer: Self-pay | Admitting: Vascular Surgery

## 2014-08-15 ENCOUNTER — Ambulatory Visit (INDEPENDENT_AMBULATORY_CARE_PROVIDER_SITE_OTHER): Payer: Self-pay | Admitting: Vascular Surgery

## 2014-08-15 VITALS — BP 103/57 | HR 68 | Ht 61.0 in | Wt 105.5 lb

## 2014-08-15 DIAGNOSIS — I724 Aneurysm of artery of lower extremity: Secondary | ICD-10-CM

## 2014-08-15 NOTE — Progress Notes (Signed)
Subjective:     Patient ID: Karen Barnett, female   DOB: 04/10/1956, 59 y.o.   MRN: [id number]  HPI this 59 year old female returns for initial follow-up regarding her resection of right popliteal artery aneurysm with insertion of short interposition saphenous vein graft from proximal popliteal to above-knee popliteal artery. Saphenous vein from the right proximal thigh was utilized. Patient has no specific complaints today. She is ambulating well.   Review of Systems     Objective:   Physical Exam BP 103/57 mmHg  Pulse 68  Ht 5\' 1"  (1.549 m)  Wt 105 lb 8 oz (47.854 kg)  BMI 19.94 kg/m2  SpO2 100%  Gen. well-developed well-nourished female no apparent stress alert and oriented 3 Right leg incisions are healing nicely. 3+ popliteal and dorsalis pedis pulse palpable on the right. No distal edema noted.     Assessment:     Doing well 2 weeks post resection right popliteal aneurysm with insertion of interposition saphenous vein graft    Plan:     Increased activity as tolerated okay to drive automobile Return in 2 months for duplex scan of right lower extremity bypass graft and check ABIs

## 2014-09-06 ENCOUNTER — Encounter: Payer: Self-pay | Admitting: Family Medicine

## 2014-09-18 ENCOUNTER — Ambulatory Visit (INDEPENDENT_AMBULATORY_CARE_PROVIDER_SITE_OTHER): Payer: Self-pay | Admitting: Vascular Surgery

## 2014-09-18 ENCOUNTER — Encounter: Payer: Self-pay | Admitting: Vascular Surgery

## 2014-09-18 VITALS — BP 121/77 | HR 68 | Temp 98.0°F | Resp 14 | Ht 60.0 in | Wt 106.0 lb

## 2014-09-18 DIAGNOSIS — I724 Aneurysm of artery of lower extremity: Secondary | ICD-10-CM

## 2014-09-18 NOTE — Progress Notes (Signed)
Subjective:     Patient ID: Karen Barnett, female   DOB: 13-Aug-1955, 59 y.o.   MRN: [id number]  HPI this 59 year old female returns 6 weeks post resection right popliteal artery aneurysm with insertion interposition vein graft from proximal right leg. She noticed some mild redness at the lower and of her distal thigh incision with the surgery was performed. She was started on Keflex by Dr. Myra GianottiBrabham over the weekend and comes today for inspection. She has had no chills and fever. She has had some mild distal edema. She denies claudication. Review of Systems     Objective:   Physical Exam BP 121/77 mmHg  Pulse 68  Temp(Src) 98 F (36.7 C)  Resp 14  Ht 5' (1.524 m)  Wt 106 lb (48.081 kg)  BMI 20.70 kg/m2  Under well-developed well-nourished female in no apparent distress alert and oriented 3 Right lower extremity with nicely healed inguinal incisions and distal thigh incision with very localized stitch abscess from the Vicryl not distally. This was slightly surrounded with some mild erythema. Minimal peri-lens was expressed. 3+ popliteal and dorsalis pedis pulse palpable.     Assessment:     Stitch abscess right thigh wound following repair right popliteal artery aneurysm with interposition saphenous vein graft-started on Keflex yesterday    Plan:     No evidence of significant infection. Patient will continue 10 day course of Keflex and return as scheduled for ABIs and duplex scan Also suggested she put moist heat on this area and keep covered for the next few days.

## 2014-09-25 ENCOUNTER — Other Ambulatory Visit: Payer: Self-pay | Admitting: Family Medicine

## 2014-09-25 NOTE — Telephone Encounter (Signed)
Medication refill per protocol °

## 2014-09-27 ENCOUNTER — Telehealth: Payer: Self-pay | Admitting: Family Medicine

## 2014-09-27 NOTE — Telephone Encounter (Signed)
Patient is calling to speak with you regarding her bill  [phone]

## 2014-09-28 NOTE — Telephone Encounter (Signed)
Left vm asking patient to return my call. °

## 2014-10-09 ENCOUNTER — Encounter: Payer: Self-pay | Admitting: Family Medicine

## 2014-10-11 NOTE — Telephone Encounter (Signed)
Patient contacted the office with a question about her bill. I tried calling her back and spoke with her this time.  She wanted to ask if we charged her any late fees if we could please waive them. I advised her that we don't charge late payment fees. I did tell her that the last statement went out the day before she paid $88.46. She asked if her account was paid in full I advised her that [facility] which was $[facility].00. She said that she would pay it as soon as she could.

## 2014-10-13 ENCOUNTER — Encounter: Payer: Self-pay | Admitting: Vascular Surgery

## 2014-10-17 ENCOUNTER — Ambulatory Visit (INDEPENDENT_AMBULATORY_CARE_PROVIDER_SITE_OTHER)
Admission: RE | Admit: 2014-10-17 | Discharge: 2014-10-17 | Disposition: A | Discharge status: Home / Self Care | Payer: BLUE CROSS/BLUE SHIELD | Source: Ambulatory Visit | Attending: Vascular Surgery | Admitting: Vascular Surgery

## 2014-10-17 ENCOUNTER — Encounter: Payer: Self-pay | Admitting: Vascular Surgery

## 2014-10-17 ENCOUNTER — Ambulatory Visit (INDEPENDENT_AMBULATORY_CARE_PROVIDER_SITE_OTHER): Payer: Self-pay | Admitting: Vascular Surgery

## 2014-10-17 ENCOUNTER — Ambulatory Visit (HOSPITAL_COMMUNITY)
Admission: RE | Admit: 2014-10-17 | Discharge: 2014-10-17 | Disposition: A | Discharge status: Home / Self Care | Payer: BLUE CROSS/BLUE SHIELD | Source: Ambulatory Visit | Attending: Vascular Surgery | Admitting: Vascular Surgery

## 2014-10-17 VITALS — BP 113/70 | HR 64 | Resp 14 | Ht 60.0 in | Wt 106.0 lb

## 2014-10-17 DIAGNOSIS — I724 Aneurysm of artery of lower extremity: Secondary | ICD-10-CM

## 2014-10-17 DIAGNOSIS — Z48812 Encounter for surgical aftercare following surgery on the circulatory system: Secondary | ICD-10-CM | POA: Diagnosis not present

## 2014-10-17 NOTE — Progress Notes (Signed)
Subjective:     Patient ID: Karen Barnett, female   DOB: 06-Dec-1955, 59 y.o.   MRN: [id number]  HPI this 59 year old female returns for continued follow-up regarding her resection of right popliteal aneurysm with insertion of reversed saphenous vein graft from above-knee popliteal to above-knee popliteal artery performed on 08/02/2014. She did develop a superficial stitch abscess which is now resolved. She is not having specific claudication symptoms in either leg. She does have some mild edema in the right leg below the knee.   Review of Systems     Objective:   Physical Exam BP 113/70 mmHg  Pulse 64  Resp 14  Ht 5' (1.524 m)  Wt 106 lb (48.081 kg)  BMI 20.70 kg/m2  Gen. well-developed well-nourished female no apparent distress alert and oriented 3 Right lower extremity with well-healed distal thigh incision and proximal thigh incision. 3+ popliteal and dorsalis pedis pulse palpable. 1+ edema distal right lower leg area  Today I ordered right leg ABIs with result revealing triphasic waveforms with ABI 1.12-normal    Assessment:     Doing well 6 weeks post saphenous vein graft 4 resection of right popliteal artery aneurysm. Mild postoperative edema.    Plan:     Return in 3 months for duplex scan of right lower extremity bypass and recheck ABIs Continue daily aspirin 81 mg and Lipitor

## 2014-10-17 NOTE — Addendum Note (Signed)
Addended by: Adria Dill L on: 10/17/2014 02:05 PM   Modules accepted: Orders

## 2014-11-09 ENCOUNTER — Encounter: Payer: Self-pay | Admitting: Family Medicine

## 2015-01-29 ENCOUNTER — Encounter: Payer: Self-pay | Admitting: Vascular Surgery

## 2015-01-30 ENCOUNTER — Other Ambulatory Visit (HOSPITAL_COMMUNITY): Payer: BLUE CROSS/BLUE SHIELD

## 2015-01-30 ENCOUNTER — Ambulatory Visit: Payer: BLUE CROSS/BLUE SHIELD | Admitting: Vascular Surgery

## 2015-01-30 ENCOUNTER — Encounter (HOSPITAL_COMMUNITY): Payer: BLUE CROSS/BLUE SHIELD

## 2015-03-15 ENCOUNTER — Encounter: Payer: Self-pay | Admitting: Vascular Surgery

## 2015-03-20 ENCOUNTER — Ambulatory Visit (INDEPENDENT_AMBULATORY_CARE_PROVIDER_SITE_OTHER)
Admission: RE | Admit: 2015-03-20 | Discharge: 2015-03-20 | Disposition: A | Discharge status: Home / Self Care | Payer: BLUE CROSS/BLUE SHIELD | Source: Ambulatory Visit | Attending: Vascular Surgery | Admitting: Vascular Surgery

## 2015-03-20 ENCOUNTER — Ambulatory Visit (INDEPENDENT_AMBULATORY_CARE_PROVIDER_SITE_OTHER): Payer: BLUE CROSS/BLUE SHIELD | Admitting: Vascular Surgery

## 2015-03-20 ENCOUNTER — Encounter: Payer: Self-pay | Admitting: Vascular Surgery

## 2015-03-20 ENCOUNTER — Ambulatory Visit (HOSPITAL_COMMUNITY)
Admission: RE | Admit: 2015-03-20 | Discharge: 2015-03-20 | Disposition: A | Discharge status: Home / Self Care | Payer: BLUE CROSS/BLUE SHIELD | Source: Ambulatory Visit | Attending: Vascular Surgery | Admitting: Vascular Surgery

## 2015-03-20 VITALS — BP 108/64 | HR 60 | Temp 98.0°F | Resp 14 | Ht 60.0 in | Wt 108.0 lb

## 2015-03-20 DIAGNOSIS — Z48812 Encounter for surgical aftercare following surgery on the circulatory system: Secondary | ICD-10-CM | POA: Diagnosis not present

## 2015-03-20 DIAGNOSIS — I724 Aneurysm of artery of lower extremity: Secondary | ICD-10-CM | POA: Insufficient documentation

## 2015-03-20 NOTE — Progress Notes (Signed)
Subjective:     Patient ID: Karen CantorCathy F Barnett, female   DOB: 12-07-1955, 59 y.o.   MRN: [id number]  HPI this 59 year old female returns 8 months post right distal superficial femoral to above-knee popliteal artery bypass for popliteal aneurysm. Reverse saphenous vein graft was utilized. Patient has done well with no symptoms of claudication. She did have some decrease in sedation in the pretibial region which is now resolving. She has no complaints. She takes aspirin daily and Lipitor.  Past Medical History  Diagnosis Date  . Crohn's colitis (HCC)   . Crohn's disease (HCC)   . Popliteal aneurysm (HCC)     Right  . Anxiety     situational    Social History  Substance Use Topics  . Smoking status: Former Smoker    Quit date: 02/14/2009  . Smokeless tobacco: Never Used  . Alcohol Use: 2.4 oz/week    4 Shots of liquor per week     Comment: occasional     Family History  Problem Relation Age of Onset  . Diabetes Father   . Diabetes Brother   . Diabetes Brother     No Known Allergies   Current outpatient prescriptions:  .  aspirin EC 81 MG tablet, Take 1 tablet (81 mg total) by mouth daily., Disp: , Rfl:  .  atorvastatin (LIPITOR) 10 MG tablet, TAKE 1 TABLET EVERY DAY, Disp: 30 tablet, Rfl: 4 .  cyanocobalamin (,VITAMIN B-12,) 1000 MCG/ML injection, INJECT 1ML EVERY MONTH, Disp: 10 mL, Rfl: 0 .  ibuprofen (ADVIL,MOTRIN) 200 MG tablet, Take 200-400 mg by mouth every 6 (six) hours as needed., Disp: , Rfl:  .  oxyCODONE (ROXICODONE) 5 MG immediate release tablet, Take 1 tablet (5 mg total) by mouth every 6 (six) hours as needed. (Patient not taking: Reported on 03/20/2015), Disp: 30 tablet, Rfl: 0  Filed Vitals:   03/20/15 1519  BP: 108/64  Pulse: 60  Temp: 98 F (36.7 C)  TempSrc: Oral  Resp: 14  Height: 5' (1.524 m)  Weight: 108 lb (48.988 kg)  SpO2: 100%    Body mass index is 21.09 kg/(m^2).           Review of Systems denies chest pain, dyspnea on exertion,  PND, orthopnea, hemoptysis, claudication.     Objective:   Physical Exam BP 108/64 mmHg  Pulse 60  Temp(Src) 98 F (36.7 C) (Oral)  Resp 14  Ht 5' (1.524 m)  Wt 108 lb (48.988 kg)  BMI 21.09 kg/m2  SpO2 100%  Gen.-alert and oriented x3 in no apparent distress HEENT normal for age Lungs no rhonchi or wheezing Cardiovascular regular rhythm no murmurs carotid pulses 3+ palpable no bruits audible Abdomen soft nontender no palpable masses Musculoskeletal free of  major deformities Skin clear -no rashes Neurologic normal Lower extremities 3+ femoral and dorsalis pedis pulses palpable bilaterally with no edema  Today I ordered a duplex scan of the right lower extremity bypass graft and ABIs. Duplex scan reveals a graft we widely patent with no areas of stenosis or increased velocity. ABI is 1.19 with triphasic flow.     Assessment:     Nicely functioning distal superficial femoral to above-knee popliteal saphenous vein graft for popliteal aneurysm    Plan:     Return in 6 months with repeat duplex scan of bypass and repeat ABIs Continue daily aspirin and we'll discuss Lipitor usage with her medical doctor

## 2015-03-20 NOTE — Addendum Note (Signed)
Addended by: Adria DillELDRIDGE-LEWIS, Jarrah Babich L on: 03/20/2015 04:26 PM   Modules accepted: Orders

## 2015-07-27 ENCOUNTER — Telehealth: Payer: Self-pay | Admitting: Family Medicine

## 2015-07-27 MED ORDER — CYANOCOBALAMIN 1000 MCG/ML IJ SOLN: INTRAMUSCULAR | Status: DC

## 2015-07-27 NOTE — Telephone Encounter (Signed)
Medication called/sent to requested pharmacy  

## 2015-07-27 NOTE — Telephone Encounter (Signed)
Pt is requesting a refill of Cyanocobalamin 1000 mcg [facility] [facility] [address]

## 2016-03-25 ENCOUNTER — Encounter (HOSPITAL_COMMUNITY): Payer: BLUE CROSS/BLUE SHIELD

## 2016-03-25 ENCOUNTER — Other Ambulatory Visit (HOSPITAL_COMMUNITY): Payer: BLUE CROSS/BLUE SHIELD

## 2016-03-25 ENCOUNTER — Ambulatory Visit: Payer: BLUE CROSS/BLUE SHIELD | Admitting: Family

## 2016-06-04 ENCOUNTER — Encounter: Payer: Self-pay | Admitting: Cardiology

## 2016-06-17 ENCOUNTER — Ambulatory Visit (INDEPENDENT_AMBULATORY_CARE_PROVIDER_SITE_OTHER): Payer: BLUE CROSS/BLUE SHIELD

## 2016-06-17 DIAGNOSIS — Z23 Encounter for immunization: Secondary | ICD-10-CM

## 2016-06-19 ENCOUNTER — Encounter: Payer: Self-pay | Admitting: Family Medicine

## 2016-06-19 ENCOUNTER — Ambulatory Visit (INDEPENDENT_AMBULATORY_CARE_PROVIDER_SITE_OTHER): Payer: BLUE CROSS/BLUE SHIELD | Admitting: Family Medicine

## 2016-06-19 VITALS — BP 110/72 | HR 68 | Temp 98.4°F | Resp 14 | Ht 60.0 in | Wt 104.0 lb

## 2016-06-19 DIAGNOSIS — E7800 Pure hypercholesterolemia, unspecified: Secondary | ICD-10-CM | POA: Diagnosis not present

## 2016-06-19 DIAGNOSIS — E538 Deficiency of other specified B group vitamins: Secondary | ICD-10-CM

## 2016-06-19 DIAGNOSIS — I724 Aneurysm of artery of lower extremity: Secondary | ICD-10-CM | POA: Diagnosis not present

## 2016-06-19 MED ORDER — CYANOCOBALAMIN 1000 MCG/ML IJ SOLN
1000.0000 ug | Freq: Once | INTRAMUSCULAR | Status: AC
  Administered 2016-06-19: 1000 ug via INTRAMUSCULAR

## 2016-06-19 MED ORDER — CYANOCOBALAMIN 1000 MCG/ML IJ SOLN: INTRAMUSCULAR | 3 refills | Status: DC

## 2016-06-19 NOTE — Progress Notes (Signed)
Subjective:    Patient ID: Karen Barnett, female    DOB: 1955-10-17, 61 y.o.   MRN: [id number]  HPI Patient has a history of hyperlipidemia although she is no longer taking Lipitor. She also has a history of a popliteal artery aneurysm that was corrected surgically more than a year ago. She is due for a B12 shot.  She sees a gynecologist who performs her mammogram and Pap smear. She is on a schedule this. She has a history of Crohn's disease, her colonoscopy is up-to-date and was recently performed. Her gynecologist performs her bone density test. She states that she has a history of osteopenia but is not taking any calcium or vitamin D Past Medical History:  Diagnosis Date  . Anxiety    situational  . Crohn's colitis (HCC)   . Crohn's disease (HCC)   . Popliteal aneurysm (HCC)    Right   Past Surgical History:  Procedure Laterality Date  . ABDOMINAL AORTAGRAM    . ABDOMINAL AORTAGRAM N/A 08/01/2014   Procedure: ABDOMINAL Ronny FlurryAORTAGRAM;  Surgeon: Nada LibmanVance W Brabham, MD;  [facility];  Service: Cardiovascular;  Laterality: N/A;  . COLON SURGERY    . COLONOSCOPY W/ BIOPSIES AND POLYPECTOMY    . DILATION AND CURETTAGE OF UTERUS    . FEMORAL-POPLITEAL BYPASS GRAFT Right 08/02/2014   Procedure: Resection Right popliteal artery aneurysm with Insertion of Reversed Saphenous Vein Graft Above Knee to Above Knee Popliteal;  Surgeon: Pryor OchoaJames D Lawson, MD;  [facility];  Service: Vascular;  Laterality: Right;  . VEIN HARVEST Right 08/02/2014   Procedure: Saphenous VEIN HARVEST Right Leg;  Surgeon: Pryor OchoaJames D Lawson, MD;  [facility];  Service: Vascular;  Laterality: Right;  . WISDOM TOOTH EXTRACTION     Current Outpatient Prescriptions on File Prior to Visit  Medication Sig Dispense Refill  . aspirin EC 81 MG tablet Take 1 tablet (81 mg total) by mouth daily.    Marland Kitchen. atorvastatin (LIPITOR) 10 MG tablet TAKE 1 TABLET EVERY DAY (Patient not taking: Reported on 06/19/2016) 30 tablet 4   No  current facility-administered medications on file prior to visit.    No Known Allergies Social History   Social History  . Marital status: Married    Spouse name: N/A  . Number of children: N/A  . Years of education: N/A   Occupational History  . Not on file.   Social History Main Topics  . Smoking status: Former Smoker    Quit date: 02/14/2009  . Smokeless tobacco: Never Used  . Alcohol use 2.4 oz/week    4 Shots of liquor per week     Comment: occasional   . Drug use: No  . Sexual activity: Yes   Other Topics Concern  . Not on file   Social History Narrative  . No narrative on file   Family History  Problem Relation Age of Onset  . Diabetes Father   . Diabetes Brother   . Diabetes Brother      Review of Systems  All other systems reviewed and are negative.      Objective:   Physical Exam  Constitutional: She appears well-developed and well-nourished. No distress.  HENT:  Right Ear: External ear normal.  Left Ear: External ear normal.  Nose: Nose normal.  Mouth/Throat: Oropharynx is clear and moist. No oropharyngeal exudate.  Eyes: Conjunctivae and EOM are normal. Pupils are equal, round, and reactive to light.  Neck: Neck supple. No JVD present. No  thyromegaly present.  Cardiovascular: Normal rate, regular rhythm and normal heart sounds.   No murmur heard. Pulmonary/Chest: Effort normal and breath sounds normal. No respiratory distress. She has no wheezes. She has no rales. She exhibits no tenderness.  Abdominal: Soft. Bowel sounds are normal. She exhibits no distension. There is no tenderness. There is no rebound and no guarding.  Lymphadenopathy:    She has no cervical adenopathy.  Skin: She is not diaphoretic.  Vitals reviewed.         Assessment & Plan:  B12 deficiency - Plan: cyanocobalamin ((VITAMIN B-12)) injection 1,000 mcg  Popliteal artery aneurysm (HCC)  Pure hypercholesterolemia - Plan: CBC with Differential/Platelet, COMPLETE  METABOLIC PANEL WITH GFR, Lipid panel  I will have the patient come back fasting for a CBC, CMP, fasting lipid panel. Goal LDL cholesterol is less than 130. Patient gets Pap smear and mammogram and bone density test at her GYN. Colonoscopy is reportedly up-to-date per the patient. She is due for a flu shot.

## 2016-06-24 ENCOUNTER — Encounter: Payer: Self-pay | Admitting: Family

## 2016-06-25 ENCOUNTER — Other Ambulatory Visit: Payer: BLUE CROSS/BLUE SHIELD

## 2016-06-25 LAB — CBC WITH DIFFERENTIAL/PLATELET
Basophils Absolute: 0 cells/uL (ref 0–200)
Basophils Relative: 0 %
Eosinophils Absolute: 88 cells/uL (ref 15–500)
Eosinophils Relative: 1 %
HEMATOCRIT: 37.7 % (ref 35.0–45.0)
HEMOGLOBIN: 12.3 g/dL (ref 12.0–15.0)
LYMPHS ABS: 3696 {cells}/uL (ref 850–3900)
Lymphocytes Relative: 42 %
MCH: 30.3 pg (ref 27.0–33.0)
MCHC: 32.6 g/dL (ref 32.0–36.0)
MCV: 92.9 fL (ref 80.0–100.0)
MONO ABS: 616 {cells}/uL (ref 200–950)
MPV: 10 fL (ref 7.5–12.5)
Monocytes Relative: 7 %
NEUTROS ABS: 4400 {cells}/uL (ref 1500–7800)
NEUTROS PCT: 50 %
Platelets: 261 10*3/uL (ref 140–400)
RBC: 4.06 MIL/uL (ref 3.80–5.10)
RDW: 14.8 % (ref 11.0–15.0)
WBC: 8.8 10*3/uL (ref 3.8–10.8)

## 2016-06-25 LAB — COMPLETE METABOLIC PANEL WITH GFR
ALT: 9 U/L (ref 6–29)
AST: 12 U/L (ref 10–35)
Albumin: 4.1 g/dL (ref 3.6–5.1)
Alkaline Phosphatase: 43 U/L (ref 33–130)
BUN: 8 mg/dL (ref 7–25)
CALCIUM: 9.2 mg/dL (ref 8.6–10.4)
CO2: 28 mmol/L (ref 20–31)
CREATININE: 0.66 mg/dL (ref 0.50–1.05)
Chloride: 104 mmol/L (ref 98–110)
GFR, Est African American: >89 mL/min (ref >=60)
GFR, Est Non African American: >89 mL/min (ref >=60)
Glucose, Bld: 94 mg/dL (ref 70–99)
Potassium: 4.2 mmol/L (ref 3.5–5.3)
Sodium: 141 mmol/L (ref 135–146)
TOTAL PROTEIN: 6 g/dL — AB (ref 6.1–8.1)
Total Bilirubin: 0.5 mg/dL (ref 0.2–1.2)

## 2016-06-25 LAB — LIPID PANEL
Cholesterol: 204 mg/dL — ABNORMAL HIGH (ref <200)
HDL: 101 mg/dL (ref >50)
LDL Cholesterol: 84 mg/dL (ref <100)
Total CHOL/HDL Ratio: 2 Ratio (ref <5.0)
Triglycerides: 94 mg/dL (ref <150)
VLDL: 19 mg/dL (ref <30)

## 2016-06-30 ENCOUNTER — Encounter: Payer: Self-pay | Admitting: Family

## 2016-06-30 ENCOUNTER — Ambulatory Visit (INDEPENDENT_AMBULATORY_CARE_PROVIDER_SITE_OTHER): Payer: BLUE CROSS/BLUE SHIELD | Admitting: Family

## 2016-06-30 ENCOUNTER — Ambulatory Visit (HOSPITAL_COMMUNITY)
Admission: RE | Admit: 2016-06-30 | Discharge: 2016-06-30 | Disposition: A | Discharge status: Home / Self Care | Payer: BLUE CROSS/BLUE SHIELD | Source: Ambulatory Visit | Attending: Vascular Surgery | Admitting: Vascular Surgery

## 2016-06-30 VITALS — BP 108/64 | HR 64 | Temp 98.1°F | Resp 16 | Ht 60.0 in | Wt 105.0 lb

## 2016-06-30 DIAGNOSIS — I724 Aneurysm of artery of lower extremity: Secondary | ICD-10-CM | POA: Insufficient documentation

## 2016-06-30 DIAGNOSIS — Z95828 Presence of other vascular implants and grafts: Secondary | ICD-10-CM

## 2016-06-30 DIAGNOSIS — R938 Abnormal findings on diagnostic imaging of other specified body structures: Secondary | ICD-10-CM | POA: Insufficient documentation

## 2016-06-30 DIAGNOSIS — R0989 Other specified symptoms and signs involving the circulatory and respiratory systems: Secondary | ICD-10-CM | POA: Diagnosis present

## 2016-06-30 DIAGNOSIS — Z87891 Personal history of nicotine dependence: Secondary | ICD-10-CM | POA: Insufficient documentation

## 2016-06-30 DIAGNOSIS — Z48812 Encounter for surgical aftercare following surgery on the circulatory system: Secondary | ICD-10-CM | POA: Diagnosis not present

## 2016-06-30 NOTE — Patient Instructions (Signed)

## 2016-06-30 NOTE — Progress Notes (Signed)
[facility] [place]   CC: Follow up peripheral artery occlusive disease  History of Present Illness EVONNE RINKS is a 61 y.o. 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 Hart Rochester.  Pt detected a prominent right popliteal pulse prior to the above surgery,  with no claudication symptoms.  Patient has done well with no symptoms of claudication. She takes aspirin daily and Lipitor.  She has had a small and large intestine colectomy for chronic Crohn's Disease.   She walks 30 minutes daily and pet sits.   Pt Diabetic: No Pt smoker: former smoker, quit in 2006, had quit several times before for long stretches, started smoking about age 65 years  Pt meds include: Statin :no, pt states she stopped taking, had no adverse reaction, will discuss whether she should take with her gastroenterologist.  Betablocker: No ASA: Yes Other anticoagulants/antiplatelets: no  Past Medical History:  Diagnosis Date  . Anxiety    situational  . Crohn's colitis (HCC)   . Crohn's disease (HCC)   . Popliteal aneurysm (HCC)    Right    Social History Social History  Substance Use Topics  . Smoking status: Former Smoker    Quit date: 02/14/2009  . Smokeless tobacco: Never Used  . Alcohol use 2.4 oz/week    4 Shots of liquor per week     Comment: occasional     Family History Family History  Problem Relation Age of Onset  . Diabetes Father   . Diabetes Brother   . Diabetes Brother     Past Surgical History:  Procedure Laterality Date  . ABDOMINAL AORTAGRAM    . ABDOMINAL AORTAGRAM N/A 08/01/2014   Procedure: ABDOMINAL Ronny Flurry;  Surgeon: Nada Libman, MD;  Location: [facility] CATH LAB;  Service: Cardiovascular;  Laterality: N/A;  . COLON SURGERY    . COLONOSCOPY W/ BIOPSIES AND POLYPECTOMY    . DILATION AND CURETTAGE OF UTERUS    . FEMORAL-POPLITEAL  BYPASS GRAFT Right 08/02/2014   Procedure: Resection Right popliteal artery aneurysm with Insertion of Reversed Saphenous Vein Graft Above Knee to Above Knee Popliteal;  Surgeon: Pryor Ochoa, MD;  Location: [facility] OR;  Service: Vascular;  Laterality: Right;  . VEIN HARVEST Right 08/02/2014   Procedure: Saphenous VEIN HARVEST Right Leg;  Surgeon: Pryor Ochoa, MD;  Location: [facility] OR;  Service: Vascular;  Laterality: Right;  . WISDOM TOOTH EXTRACTION      No Known Allergies  Current Outpatient Prescriptions  Medication Sig Dispense Refill  . cyanocobalamin (,VITAMIN B-12,) 1000 MCG/ML injection INJECT EVERY MONTH 3 mL 3  . aspirin EC 81 MG tablet Take 1 tablet (81 mg total) by mouth daily. (Patient not taking: Reported on 06/30/2016)    . atorvastatin (LIPITOR) 10 MG tablet TAKE 1 TABLET EVERY DAY (Patient not taking: Reported on 06/19/2016) 30 tablet 4   No current facility-administered medications for this visit.     ROS: See HPI for pertinent positives and negatives.   Physical Examination  Vitals:   06/30/16 1436  BP: 108/64  Pulse: 64  Resp: 16  Temp: 98.1 F (36.7 C)  TempSrc: Oral  SpO2: 98%  Weight: 105 lb (47.6 kg)  Height: 5' (1.524 m)   Body mass index is 20.51 kg/m.  General: A&O x 3, WDWN, petite female. Gait: normal Eyes: PERRLA. Pulmonary: Respirations are non labored, CTAB, good air movement  Cardiac: regular rhythm, no detected murmur.         Carotid Bruits Right Left   Negative Negative  Aorta is palpable. Radial pulses: 2+ palpable and =                           VASCULAR EXAM: Extremities without ischemic changes, without Gangrene; without open wounds.                                                                                                          LE Pulses Right Left       FEMORAL  2+ palpable  2+ palpable        POPLITEAL  3+ palpable   3+ palpable       POSTERIOR TIBIAL  2+ palpable   1+ palpable        DORSALIS PEDIS       ANTERIOR TIBIAL 2+ palpable  2+ palpable    Abdomen: soft, NT, no palpable masses. Skin: no rashes, no ulcers noted. Musculoskeletal: no muscle wasting or atrophy.  Neurologic: A&O X 3; Appropriate Affect ; SENSATION: normal; MOTOR FUNCTION:  moving all extremities equally, motor strength 5/5 throughout. Speech is fluent/normal. CN 2-12 intact.    DATA  Non-Invasive Vascular Imaging: DATE: 06/30/2016 ABI:  RIGHT: 1.12 (1.19, 03-20-15), Waveforms: triphasic; TBI: 0.92 ( was 0.86) LEFT: 1.10 (was 1.26), Waveforms: triphasic; TBI: 0.67 (was 0.70)  DUPLEX SCAN OF BYPASS:  Patent right leg bypass graft with no evidence of stenosis. No significant change since the exam on 03-20-15.     Previous angiogram: 08/01/2014 Pre-operative Diagnosis: Right popliteal aneurysm Post-operative diagnosis:  Same Surgeon:  Jorge NyBRABHAM IV, V. WELLS Procedure Performed:             1.  Ultrasound-guided access, left femoral artery             2.  Abdominal aortogram             3.  Bilateral lower extremity runoff             4.  Second order catheterization  Findings:              Aortogram:  The suprarenal abdominal aorta is widely patent.  Bilateral renal arteries are widely patent.  The infrarenal abdominal aorta and bilateral common and external iliac arteries are widely patent.             Right Lower Extremity:  The right common femoral, profundofemoral and superficial femoral artery are widely patent.  Aneurysmal dilatation of the popliteal artery is identified.  There is a normal caliber below knee popliteal artery with three-vessel runoff             Left Lower Extremity:  Left common femoral, profundofemoral, and superficial femoral artery are widely patent.  The popliteal artery is widely patent.  There is three-vessel runoff.  There is a abnormal takeoff of the anterior tibial artery, proximal to the joint space.  Intervention:  None  Impression:             #  1  right popliteal artery  aneurysm             #2  abnormal left anterior tibial artery.  It originates at the joint space             #3  three-vessel runoff bilaterally     ASSESSMENT: ALEEYAH BENSEN is a 61 y.o. 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 Hart Rochester.  Pt detected a prominent right popliteal pulse prior to the above surgery,  with no claudication symptoms.  Patient has done well with no symptoms of claudication.  ABI's are normal today, duplex of right leg bypass with no evidence of stenosis.   PLAN:  Based on the patient's vascular studies and examination, pt will return to clinic in 1 year with ABI's and right LE arterial duplex.   I discussed in depth with the patient the nature of atherosclerosis, and emphasized the importance of maximal medical management including strict control of blood pressure, blood glucose, and lipid levels, obtaining regular exercise, and continued cessation of smoking.  The patient is aware that without maximal medical management the underlying atherosclerotic disease process will progress, limiting the benefit of any interventions.  The patient was given information about PAD including signs, symptoms, treatment, what symptoms should prompt the patient to seek immediate medical care, and risk reduction measures to take.  Charisse March, RN, MSN, FNP-C [facility] [place] Phone: [phone]  Clinic MD: Myra Gianotti  06/30/16 3:03 PM

## 2016-07-01 ENCOUNTER — Encounter: Payer: Self-pay | Admitting: Cardiology

## 2016-09-26 ENCOUNTER — Encounter: Payer: Self-pay | Admitting: Family Medicine

## 2016-11-12 ENCOUNTER — Encounter: Payer: Self-pay | Admitting: Family Medicine

## 2017-07-01 ENCOUNTER — Other Ambulatory Visit: Payer: Self-pay

## 2017-07-01 DIAGNOSIS — I724 Aneurysm of artery of lower extremity: Secondary | ICD-10-CM

## 2017-07-06 ENCOUNTER — Encounter (HOSPITAL_COMMUNITY): Payer: BLUE CROSS/BLUE SHIELD

## 2017-07-06 ENCOUNTER — Ambulatory Visit: Payer: BLUE CROSS/BLUE SHIELD | Admitting: Family

## 2017-07-06 ENCOUNTER — Other Ambulatory Visit (HOSPITAL_COMMUNITY): Payer: BLUE CROSS/BLUE SHIELD

## 2017-09-15 ENCOUNTER — Ambulatory Visit: Payer: BLUE CROSS/BLUE SHIELD | Admitting: Family

## 2017-09-15 ENCOUNTER — Encounter: Payer: Self-pay | Admitting: Family

## 2017-09-15 ENCOUNTER — Ambulatory Visit (HOSPITAL_COMMUNITY)
Admission: RE | Admit: 2017-09-15 | Discharge: 2017-09-15 | Disposition: A | Discharge status: Home / Self Care | Payer: BLUE CROSS/BLUE SHIELD | Source: Ambulatory Visit | Attending: Surgery | Admitting: Surgery

## 2017-09-15 ENCOUNTER — Ambulatory Visit (INDEPENDENT_AMBULATORY_CARE_PROVIDER_SITE_OTHER)
Admission: RE | Admit: 2017-09-15 | Discharge: 2017-09-15 | Disposition: A | Discharge status: Home / Self Care | Payer: BLUE CROSS/BLUE SHIELD | Source: Ambulatory Visit | Attending: Surgery | Admitting: Surgery

## 2017-09-15 ENCOUNTER — Other Ambulatory Visit: Payer: Self-pay

## 2017-09-15 VITALS — BP 116/74 | HR 64 | Resp 18 | Ht 60.0 in | Wt 103.8 lb

## 2017-09-15 DIAGNOSIS — R29898 Other symptoms and signs involving the musculoskeletal system: Secondary | ICD-10-CM | POA: Diagnosis not present

## 2017-09-15 DIAGNOSIS — Z95828 Presence of other vascular implants and grafts: Secondary | ICD-10-CM

## 2017-09-15 DIAGNOSIS — Z87891 Personal history of nicotine dependence: Secondary | ICD-10-CM

## 2017-09-15 DIAGNOSIS — I724 Aneurysm of artery of lower extremity: Secondary | ICD-10-CM

## 2017-09-15 NOTE — Progress Notes (Signed)
[facility] [place]   CC: Follow up peripheral artery occlusive disease  History of Present Illness Karen Barnett is a 62 y.o. 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 Hart Rochester.  Pt detected a prominent right popliteal pulse prior to the above surgery,  with no claudication symptoms.  Patient has done well with no symptoms of claudication. She takes aspirin daily and Lipitor.  She has had a small and large intestine colectomy for chronic Crohn's Disease.   She walks 30 minutes daily and pet sits.   Pt Diabetic: No Pt smoker: former smoker, quit in 2006, had quit several times before for long stretches, started smoking about age 6 years  Pt meds include: Statin :no, pt states she stopped taking, had no adverse reaction, will discuss whether she should take with her gastroenterologist.  Betablocker: No ASA: Yes Other anticoagulants/antiplatelets: no    Past Medical History:  Diagnosis Date  . Anxiety    situational  . Crohn's colitis (HCC)   . Crohn's disease (HCC)   . Osteoporosis   . Popliteal aneurysm (HCC)    Right    Social History Social History   Tobacco Use  . Smoking status: Former Smoker    Last attempt to quit: 02/14/2009    Years since quitting: 8.5  . Smokeless tobacco: Never Used  Substance Use Topics  . Alcohol use: Yes    Alcohol/week: 2.4 oz    Types: 4 Shots of liquor per week    Comment: occasional   . Drug use: No    Family History Family History  Problem Relation Age of Onset  . Diabetes Father   . Diabetes Brother   . Diabetes Brother     Past Surgical History:  Procedure Laterality Date  . ABDOMINAL AORTAGRAM    . ABDOMINAL AORTAGRAM N/A 08/01/2014   Procedure: ABDOMINAL Ronny Flurry;  Surgeon: Nada Libman, MD;  Location: [facility] CATH LAB;  Service: Cardiovascular;  Laterality: N/A;  . COLON  SURGERY    . COLONOSCOPY W/ BIOPSIES AND POLYPECTOMY    . DILATION AND CURETTAGE OF UTERUS    . FEMORAL-POPLITEAL BYPASS GRAFT Right 08/02/2014   Procedure: Resection Right popliteal artery aneurysm with Insertion of Reversed Saphenous Vein Graft Above Knee to Above Knee Popliteal;  Surgeon: Pryor Ochoa, MD;  Location: [facility] OR;  Service: Vascular;  Laterality: Right;  . VEIN HARVEST Right 08/02/2014   Procedure: Saphenous VEIN HARVEST Right Leg;  Surgeon: Pryor Ochoa, MD;  Location: [facility] OR;  Service: Vascular;  Laterality: Right;  . WISDOM TOOTH EXTRACTION      No Known Allergies  Current Outpatient Medications  Medication Sig Dispense Refill  . aspirin EC 81 MG tablet Take 1 tablet (81 mg total) by mouth daily.    . cyanocobalamin (,VITAMIN B-12,) 1000 MCG/ML injection INJECT EVERY MONTH 3 mL 3  . atorvastatin (LIPITOR) 10 MG tablet TAKE 1 TABLET EVERY DAY (Patient not taking: Reported on 06/19/2016) 30 tablet 4   No current facility-administered medications for this visit.     ROS: See HPI for pertinent positives and negatives.   Physical Examination  Vitals:   09/15/17 1310  BP: 116/74  Pulse: 64  Resp: 18  SpO2: 96%  Weight: 103 lb 12.8 oz (47.1 kg)  Height: 5' (1.524 m)   Body mass index is 20.27 kg/m.  General: A&O x 3,  WDWN, petite female. Gait: normal Eyes: PERRLA. Pulmonary: Respirations are non labored, CTAB, good air movement Cardiac: regular rhythm, no detected murmur.         Carotid Bruits Right Left   Negative Negative   Aorta is palpable. Radial pulses: 2+ palpable and =                           VASCULAR EXAM: Extremities without ischemic changes, without Gangrene; without open wounds.                                                                                                                                                       LE Pulses Right Left       FEMORAL  2+ palpable  2+ palpable        POPLITEAL  3+ palpable   3+  palpable       POSTERIOR TIBIAL  2+ palpable   1+ palpable        DORSALIS PEDIS      ANTERIOR TIBIAL 2+ palpable  2+ palpable    Abdomen: soft, NT, no palpable masses. Skin: no rashes, no ulcers noted. Musculoskeletal: no muscle wasting or atrophy.         Neurologic: A&O X 3; Appropriate Affect ; SENSATION: normal; MOTOR FUNCTION:  moving all extremities equally, motor strength 5/5 throughout. Speech is fluent/normal. CN 2-12 intact.   .  Neurologic: A&O X 3; appropriate affect, Sensation is normal; MOTOR FUNCTION:  moving all extremities equally, motor strength 5/5 throughout. Speech is fluent/normal. CN 2-12 intact. Psychiatric: Thought content is normal, mood appropriate for clinical situation.     ASSESSMENT: Karen Barnett is a 62 y.o. 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 Hart Rochester.  Pt detected a prominent right popliteal pulse prior to the above surgery, with no claudication symptoms.  Patient has done well with no symptoms of claudication.   ABI's remain normal, duplex of right leg bypass with no evidence of stenosis.   Pt has had two episodes of right arm transient weakness in the last 6 months, triggered possibly by stress she states; is caring for aging parents, and issues with her children. No carotid duplex results on file, no hx of stroke or TIA. I offered her carotid duplex soon, she states she would rather have it done when she returns in a year for lower extremity testing. Bilateral radial pulses are 2+ palpable.  Differential diagnoses: rotator cuff problem, impingement of c-spine.  Pt states right arm felt weaker than left when pushed downward from a parallel to shoulders position.  I discussed with her the signs and sx's of stroke and TIA, and if she has any  of these to call 911.    DATA  Right LE Arterial Duplex (09/15/17): No  stenosis No significant change since the exams on 03-20-15 and 06-30-16.   ABI (Date: 09/15/2017):  R:   ABI: 1.21 (was 1.12 on 06-30-16),   PT: tri  DP: tri  TBI:  0.82 (was 0.92)  L:   ABI: 1.36 (was 1.10),   PT: tri  DP: tri  TBI: 0.86 (was 0.67)  Bilateral ABI remain normal with all triphasic waveforms. Right TBI remains normal, left TBI improved to normal.     Angiogram: 08/01/2014 Findings:  Aortogram: The suprarenal abdominal aorta is widely patent. Bilateral renal arteries are widely patent. The infrarenal abdominal aorta and bilateral common and external iliac arteries are widely patent. Right Lower Extremity: The right common femoral, profundofemoral and superficial femoral artery are widely patent. Aneurysmal dilatation of the popliteal artery is identified. There is a normal caliber below knee popliteal artery with three-vessel runoff Left Lower Extremity:Left common femoral, profundofemoral, and superficial femoral artery are widely patent. The popliteal artery is widely patent. There is three-vessel runoff. There is a abnormal takeoff of the anterior tibial artery, proximal to the joint space.  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 It originates at the joint space #3 three-vessel runoff bilaterally   PLAN:  Based on the patient's vascular studies and examination, pt will return to clinic in 1 year with ABI's and right LE arterial duplex.   I discussed in depth with the patient the nature of atherosclerosis, and emphasized the importance of maximal medical management including strict control of blood pressure, blood glucose, and lipid levels, obtaining regular exercise, and continued cessation of smoking.  The patient is aware that without maximal medical management the underlying atherosclerotic  disease process will progress, limiting the benefit of any interventions.  The patient was given information about PAD including signs, symptoms, treatment, what symptoms should prompt the patient to seek immediate medical care, and risk reduction measures to take.  Charisse March, RN, MSN, FNP-C [facility] Phone: [phone]  Clinic MD: Early  09/15/17 1:18 PM

## 2017-09-15 NOTE — Patient Instructions (Signed)
Stroke Prevention Some health problems and behaviors may make it more likely for you to have a stroke. Below are ways to lessen your risk of having a stroke.  Be active for at least 30 minutes on most or all days.  Do not smoke. Try not to be around others who smoke.  Do not drink too much alcohol. ? Do not have more than 2 drinks a day if you are a man. ? Do not have more than 1 drink a day if you are a woman and are not pregnant.  Eat healthy foods, such as fruits and vegetables. If you were put on a specific diet, follow the diet as told.  Keep your cholesterol levels under control through diet and medicines. Look for foods that are low in saturated fat, trans fat, cholesterol, and are high in fiber.  If you have diabetes, follow all diet plans and take your medicine as told.  Ask your doctor if you need treatment to lower your blood pressure. If you have high blood pressure (hypertension), follow all diet plans and take your medicine as told by your doctor.  If you are 18-39 years old, have your blood pressure checked every 3-5 years. If you are age 40 or older, have your blood pressure checked every year.  Keep a healthy weight. Eat foods that are low in calories, salt, saturated fat, trans fat, and cholesterol.  Do not take drugs.  Avoid birth control pills, if this applies. Talk to your doctor about the risks of taking birth control pills.  Talk to your doctor if you have sleep problems (sleep apnea).  Take all medicine as told by your doctor. ? You may be told to take aspirin or blood thinner medicine. Take this medicine as told by your doctor. ? Understand your medicine instructions.  Make sure any other conditions you have are being taken care of.  Get help right away if:  You suddenly lose feeling (you feel numb) or have weakness in your face, arm, or leg.  Your face or eyelid hangs down to one side.  You suddenly feel confused.  You have trouble talking  (aphasia) or understanding what people are saying.  You suddenly have trouble seeing in one or both eyes.  You suddenly have trouble walking.  You are dizzy.  You lose your balance or your movements are clumsy (uncoordinated).  You suddenly have a very bad headache and you do not know the cause.  You have new chest pain.  Your heart feels like it is fluttering or skipping a beat (irregular heartbeat). Do not wait to see if the symptoms above go away. Get help right away. Call your local emergency services (911 in [place] Do not drive yourself to the hospital. This information is not intended to replace advice given to you by your health care provider. Make sure you discuss any questions you have with your health care provider. Document Released: 10/21/2011 Document Revised: 09/27/2015 Document Reviewed: 10/22/2012 Elsevier Interactive Patient Education  2018 Elsevier Inc.     Peripheral Vascular Disease Peripheral vascular disease (PVD) is a disease of the blood vessels that are not part of your heart and brain. A simple term for PVD is poor circulation. In most cases, PVD narrows the blood vessels that carry blood from your heart to the rest of your body. This can result in a decreased supply of blood to your arms, legs, and internal organs, like your stomach or kidneys. However, it most often affects   a person's lower legs and feet. There are two types of PVD.  Organic PVD. This is the more common type. It is caused by damage to the structure of blood vessels.  Functional PVD. This is caused by conditions that make blood vessels contract and tighten (spasm).  Without treatment, PVD tends to get worse over time. PVD can also lead to acute ischemic limb. This is when an arm or limb suddenly has trouble getting enough blood. This is a medical emergency. Follow these instructions at home:  Take medicines only as told by your doctor.  Do not use any tobacco products, including  cigarettes, chewing tobacco, or electronic cigarettes. If you need help quitting, ask your doctor.  Lose weight if you are overweight, and maintain a healthy weight as told by your doctor.  Eat a diet that is low in fat and cholesterol. If you need help, ask your doctor.  Exercise regularly. Ask your doctor for some good activities for you.  Take good care of your feet. ? Wear comfortable shoes that fit well. ? Check your feet often for any cuts or sores. Contact a doctor if:  You have cramps in your legs while walking.  You have leg pain when you are at rest.  You have coldness in a leg or foot.  Your skin changes.  You are unable to get or have an erection (erectile dysfunction).  You have cuts or sores on your feet that are not healing. Get help right away if:  Your arm or leg turns cold and blue.  Your arms or legs become red, warm, swollen, painful, or numb.  You have chest pain or trouble breathing.  You suddenly have weakness in your face, arm, or leg.  You become very confused or you cannot speak.  You suddenly have a very bad headache.  You suddenly cannot see. This information is not intended to replace advice given to you by your health care provider. Make sure you discuss any questions you have with your health care provider. Document Released: 07/16/2009 Document Revised: 09/27/2015 Document Reviewed: 09/29/2013 Elsevier Interactive Patient Education  2017 Elsevier Inc.  

## 2017-10-13 ENCOUNTER — Encounter: Payer: Self-pay | Admitting: Family Medicine

## 2017-10-13 ENCOUNTER — Ambulatory Visit: Payer: BLUE CROSS/BLUE SHIELD | Admitting: Family Medicine

## 2017-10-13 VITALS — BP 128/70 | HR 72 | Temp 98.2°F | Resp 16 | Ht 60.0 in | Wt 103.0 lb

## 2017-10-13 DIAGNOSIS — E538 Deficiency of other specified B group vitamins: Secondary | ICD-10-CM | POA: Diagnosis not present

## 2017-10-13 DIAGNOSIS — E7800 Pure hypercholesterolemia, unspecified: Secondary | ICD-10-CM

## 2017-10-13 DIAGNOSIS — Z114 Encounter for screening for human immunodeficiency virus [HIV]: Secondary | ICD-10-CM

## 2017-10-13 DIAGNOSIS — Z1159 Encounter for screening for other viral diseases: Secondary | ICD-10-CM | POA: Diagnosis not present

## 2017-10-13 MED ORDER — CYANOCOBALAMIN 1000 MCG/ML IJ SOLN: INTRAMUSCULAR | 11 refills | Status: DC

## 2017-10-13 NOTE — Progress Notes (Signed)
Subjective:    Patient ID: Karen Barnett, female    DOB: 05-17-1955, 62 y.o.   MRN: [id number]  HPI Patient has a history of hyperlipidemia although she is no longer taking Lipitor. 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  She originally walked in today requesting a refill on her B12 injection.  It was recommended that she schedule an annual exam.  She goes to her gynecologist who performs her Pap smears and her mammogram.  Her colonoscopy was performed 2 years ago and is up-to-date.  She is overdue for fasting lab work.  She is questioning whether she needs to take Lipitor.  She has been off the medication for more than a year.  Her previous fasting lipid panel was outstanding with an HDL cholesterol greater than 100.  I calculated her 10-year risk of cardiovascular disease based on her lipid panel from last year and her 10-year risk is 2.2%.  I doubt that stopping the Lipitor would cause her 10-year cardiovascular risk to rise greater than 7.5% however I would like to check her cholesterol off medication to confirm that suspicion Past Medical History:  Diagnosis Date  . Anxiety    situational  . Crohn's colitis (HCC)   . Crohn's disease (HCC)   . Osteoporosis   . Popliteal aneurysm (HCC)    Right   Past Surgical History:  Procedure Laterality Date  . ABDOMINAL AORTAGRAM    . ABDOMINAL AORTAGRAM N/A 08/01/2014   Procedure: ABDOMINAL Ronny Flurry;  Surgeon: Nada Libman, MD;  Location: [facility] CATH LAB;  Service: Cardiovascular;  Laterality: N/A;  . COLON SURGERY    . COLONOSCOPY W/ BIOPSIES AND POLYPECTOMY    . DILATION AND CURETTAGE OF UTERUS    . FEMORAL-POPLITEAL BYPASS GRAFT Right 08/02/2014   Procedure: Resection Right popliteal artery aneurysm with Insertion of Reversed Saphenous Vein Graft Above Knee to Above Knee Popliteal;  Surgeon: Pryor Ochoa, MD;  Location: [facility] OR;  Service: Vascular;  Laterality: Right;  . VEIN HARVEST Right 08/02/2014   Procedure: Saphenous VEIN HARVEST Right Leg;  Surgeon: Pryor Ochoa, MD;  Location: [facility] OR;  Service: Vascular;  Laterality: Right;  . WISDOM TOOTH EXTRACTION     Current Outpatient Medications on File Prior to Visit  Medication Sig Dispense Refill  . aspirin EC 81 MG tablet Take 1 tablet (81 mg total) by mouth daily.    Marland Kitchen atorvastatin (LIPITOR) 10 MG tablet TAKE 1 TABLET EVERY DAY (Patient not taking: Reported on 06/19/2016) 30 tablet 4   No current facility-administered medications on file prior to visit.    No Known Allergies Social History   Socioeconomic History  . Marital status: Married    Spouse name: Not on file  . Number of children: Not on file  . Years of education: Not on file  . Highest education level: Not on file  Occupational History  . Not on file  Social Needs  . Financial resource strain: Not on file  . Food insecurity:    Worry: Not on file    Inability: Not on file  . Transportation needs:    Medical: Not on file    Non-medical: Not on file  Tobacco Use  . Smoking status: Former Smoker    Last attempt to quit: 02/14/2009    Years since quitting: 8.6  . Smokeless tobacco: Never Used  Substance and Sexual Activity  . Alcohol use: Yes    Alcohol/week: 2.4 oz  Types: 4 Shots of liquor per week    Comment: occasional   . Drug use: No  . Sexual activity: Yes  Lifestyle  . Physical activity:    Days per week: Not on file    Minutes per session: Not on file  . Stress: Not on file  Relationships  . Social connections:    Talks on phone: Not on file    Gets together: Not on file    Attends religious service: Not on file    Active member of club or organization: Not on file    Attends meetings of clubs or organizations: Not on file    Relationship status: Not on file  . Intimate partner violence:    Fear of current or ex partner: Not on file    Emotionally abused: Not on file    Physically abused: Not on file    Forced sexual activity: Not on  file  Other Topics Concern  . Not on file  Social History Narrative  . Not on file   Family History  Problem Relation Age of Onset  . Diabetes Father   . Diabetes Brother   . Diabetes Brother      Review of Systems  All other systems reviewed and are negative.      Objective:   Physical Exam  Constitutional: She appears well-developed and well-nourished. No distress.  HENT:  Right Ear: External ear normal.  Left Ear: External ear normal.  Nose: Nose normal.  Mouth/Throat: Oropharynx is clear and moist. No oropharyngeal exudate.  Eyes: Pupils are equal, round, and reactive to light. Conjunctivae and EOM are normal.  Neck: Neck supple. No JVD present. No thyromegaly present.  Cardiovascular: Normal rate, regular rhythm and normal heart sounds.  No murmur heard. Pulmonary/Chest: Effort normal and breath sounds normal. No respiratory distress. She has no wheezes. She has no rales. She exhibits no tenderness.  Abdominal: Soft. Bowel sounds are normal. She exhibits no distension. There is no tenderness. There is no rebound and no guarding.  Lymphadenopathy:    She has no cervical adenopathy.  Skin: She is not diaphoretic.  Vitals reviewed.         Assessment & Plan:  Pure hypercholesterolemia - Plan: CBC with Differential/Platelet, COMPLETE METABOLIC PANEL WITH GFR, Lipid panel  B12 deficiency  I will have the patient come back fasting for a CBC, CMP, fasting lipid panel.  If her 10-year cardiovascular risk is less than 7.5% off medication, I would see no evidence-based reason for her to resume taking Lipitor.  Mammogram, colonoscopy, Pap smear up-to-date.  Immunizations are up-to-date.  We discussed hepatitis C and HIV screening and the patient would like to receive those screening test.

## 2017-10-27 ENCOUNTER — Telehealth: Payer: Self-pay | Admitting: Family Medicine

## 2017-10-27 ENCOUNTER — Other Ambulatory Visit: Payer: BLUE CROSS/BLUE SHIELD

## 2017-10-27 DIAGNOSIS — Z114 Encounter for screening for human immunodeficiency virus [HIV]: Secondary | ICD-10-CM

## 2017-10-27 DIAGNOSIS — E7800 Pure hypercholesterolemia, unspecified: Secondary | ICD-10-CM

## 2017-10-27 DIAGNOSIS — Z1159 Encounter for screening for other viral diseases: Secondary | ICD-10-CM

## 2017-10-27 NOTE — Telephone Encounter (Signed)
Pt came in and did lab work today needs us to send them to [facility] once they have resulted.

## 2017-10-28 LAB — COMPREHENSIVE METABOLIC PANEL
AG Ratio: 2.2 (calc) (ref 1.0–2.5)
ALKALINE PHOSPHATASE (APISO): 45 U/L (ref 33–130)
ALT: 9 U/L (ref 6–29)
AST: 15 U/L (ref 10–35)
Albumin: 4.1 g/dL (ref 3.6–5.1)
BUN: 8 mg/dL (ref 7–25)
CHLORIDE: 105 mmol/L (ref 98–110)
CO2: 29 mmol/L (ref 20–32)
Calcium: 9.2 mg/dL (ref 8.6–10.4)
Creat: 0.69 mg/dL (ref 0.50–0.99)
Globulin: 1.9 g/dL (calc) (ref 1.9–3.7)
Glucose, Bld: 86 mg/dL (ref 65–99)
Potassium: 4.9 mmol/L (ref 3.5–5.3)
Sodium: 142 mmol/L (ref 135–146)
TOTAL PROTEIN: 6 g/dL — AB (ref 6.1–8.1)
Total Bilirubin: 0.3 mg/dL (ref 0.2–1.2)

## 2017-10-28 LAB — LIPID PANEL
CHOLESTEROL: 190 mg/dL (ref <200)
HDL: 90 mg/dL (ref >50)
LDL Cholesterol (Calc): 81 mg/dL (calc)
Non-HDL Cholesterol (Calc): 100 mg/dL (calc) (ref <130)
TRIGLYCERIDES: 94 mg/dL (ref <150)
Total CHOL/HDL Ratio: 2.1 (calc) (ref <5.0)

## 2017-10-28 LAB — CBC WITH DIFFERENTIAL/PLATELET
BASOS ABS: 42 {cells}/uL (ref 0–200)
Basophils Relative: 0.6 %
EOS ABS: 112 {cells}/uL (ref 15–500)
Eosinophils Relative: 1.6 %
HCT: 37.9 % (ref 35.0–45.0)
Hemoglobin: 12.6 g/dL (ref 11.7–15.5)
Lymphs Abs: 2702 cells/uL (ref 850–3900)
MCH: 31.1 pg (ref 27.0–33.0)
MCHC: 33.2 g/dL (ref 32.0–36.0)
MCV: 93.6 fL (ref 80.0–100.0)
MPV: 10.5 fL (ref 7.5–12.5)
Monocytes Relative: 8.9 %
Neutro Abs: 3521 cells/uL (ref 1500–7800)
Neutrophils Relative %: 50.3 %
PLATELETS: 280 10*3/uL (ref 140–400)
RBC: 4.05 10*6/uL (ref 3.80–5.10)
RDW: 13.1 % (ref 11.0–15.0)
Total Lymphocyte: 38.6 %
WBC: 7 10*3/uL (ref 3.8–10.8)
WBCMIX: 623 {cells}/uL (ref 200–950)

## 2017-10-28 LAB — HIV ANTIBODY (ROUTINE TESTING W REFLEX): HIV: NONREACTIVE

## 2017-10-28 LAB — HEPATITIS C ANTIBODY
Hepatitis C Ab: NONREACTIVE
SIGNAL TO CUT-OFF: 0.01 (ref <1.00)

## 2017-10-30 NOTE — Telephone Encounter (Signed)
Results sent via epic routing

## 2017-11-04 ENCOUNTER — Encounter: Payer: Self-pay | Admitting: Family Medicine

## 2018-02-26 ENCOUNTER — Ambulatory Visit (INDEPENDENT_AMBULATORY_CARE_PROVIDER_SITE_OTHER): Payer: BLUE CROSS/BLUE SHIELD

## 2018-02-26 DIAGNOSIS — Z23 Encounter for immunization: Secondary | ICD-10-CM | POA: Diagnosis not present

## 2018-02-26 NOTE — Progress Notes (Signed)
Patient was in office for flu vaccine .Patient received vaccine in her left deltoid. patient tolerated well  

## 2018-06-18 ENCOUNTER — Encounter: Payer: Self-pay | Admitting: Family Medicine

## 2018-06-18 ENCOUNTER — Ambulatory Visit: Payer: BLUE CROSS/BLUE SHIELD | Admitting: Family Medicine

## 2018-06-18 VITALS — BP 110/62 | HR 70 | Temp 97.8°F | Resp 16 | Ht 60.0 in | Wt 108.0 lb

## 2018-06-18 DIAGNOSIS — R238 Other skin changes: Secondary | ICD-10-CM | POA: Diagnosis not present

## 2018-06-18 DIAGNOSIS — M810 Age-related osteoporosis without current pathological fracture: Secondary | ICD-10-CM

## 2018-06-18 DIAGNOSIS — R233 Spontaneous ecchymoses: Secondary | ICD-10-CM

## 2018-06-18 LAB — PT WITH INR/FINGERSTICK
INR FINGERSTICK: 1 ratio
PT FINGERSTICK: 11.9 s (ref 10.5–13.1)

## 2018-06-18 NOTE — Progress Notes (Signed)
Subjective:    Patient ID: Karen Barnett, female    DOB: 1956/03/23, 63 y.o.   MRN: [id number]  Patient has a history of hyperlipidemia although she is no longer taking Lipitor. 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  She has a bruise on the anterior lateral surface of her right shin.  This is approximately 10 cm x 12.5 cm.  It is starting to turn yellow and brown indicating an older bruise.  She states that a few days ago there was a burning pain in her leg.  When she looked down she noticed the bruise forming.  She denies any specific trauma or injury.  There is no redness.  There is no warmth.  There is no swelling in the leg. Past Medical History:  Diagnosis Date  . Anxiety    situational  . Crohn's colitis (HCC)   . Crohn's disease (HCC)   . Osteoporosis   . Popliteal aneurysm (HCC)    Right   Past Surgical History:  Procedure Laterality Date  . ABDOMINAL AORTAGRAM    . ABDOMINAL AORTAGRAM N/A 08/01/2014   Procedure: ABDOMINAL Ronny Flurry;  Surgeon: Nada Libman, MD;  [facility];  Service: Cardiovascular;  Laterality: N/A;  . COLON SURGERY    . COLONOSCOPY W/ BIOPSIES AND POLYPECTOMY    . DILATION AND CURETTAGE OF UTERUS    . FEMORAL-POPLITEAL BYPASS GRAFT Right 08/02/2014   Procedure: Resection Right popliteal artery aneurysm with Insertion of Reversed Saphenous Vein Graft Above Knee to Above Knee Popliteal;  Surgeon: Pryor Ochoa, MD;  Location: [facility] OR;  Service: Vascular;  Laterality: Right;  . VEIN HARVEST Right 08/02/2014   Procedure: Saphenous VEIN HARVEST Right Leg;  Surgeon: Pryor Ochoa, MD;  Location: [facility] OR;  Service: Vascular;  Laterality: Right;  . WISDOM TOOTH EXTRACTION     Current Outpatient Medications on File Prior to Visit  Medication Sig Dispense Refill  . aspirin EC 81 MG tablet Take 1 tablet (81 mg total) by mouth daily.    Marland Kitchen atorvastatin (LIPITOR) 10 MG tablet TAKE 1 TABLET EVERY DAY (Patient not taking:  Reported on 06/19/2016) 30 tablet 4  . cyanocobalamin (,VITAMIN B-12,) 1000 MCG/ML injection INJECT EVERY MONTH 3 mL 11   No current facility-administered medications on file prior to visit.    No Known Allergies Social History   Socioeconomic History  . Marital status: Married    Spouse name: Not on file  . Number of children: Not on file  . Years of education: Not on file  . Highest education level: Not on file  Occupational History  . Not on file  Social Needs  . Financial resource strain: Not on file  . Food insecurity:    Worry: Not on file    Inability: Not on file  . Transportation needs:    Medical: Not on file    Non-medical: Not on file  Tobacco Use  . Smoking status: Former Smoker    Last attempt to quit: 02/14/2009    Years since quitting: 9.3  . Smokeless tobacco: Never Used  Substance and Sexual Activity  . Alcohol use: Yes    Alcohol/week: 4.0 standard drinks    Types: 4 Shots of liquor per week    Comment: occasional   . Drug use: No  . Sexual activity: Yes  Lifestyle  . Physical activity:    Days per week: Not on file    Minutes  per session: Not on file  . Stress: Not on file  Relationships  . Social connections:    Talks on phone: Not on file    Gets together: Not on file    Attends religious service: Not on file    Active member of club or organization: Not on file    Attends meetings of clubs or organizations: Not on file    Relationship status: Not on file  . Intimate partner violence:    Fear of current or ex partner: Not on file    Emotionally abused: Not on file    Physically abused: Not on file    Forced sexual activity: Not on file  Other Topics Concern  . Not on file  Social History Narrative  . Not on file   Family History  Problem Relation Age of Onset  . Diabetes Father   . Diabetes Brother   . Diabetes Brother      Review of Systems  All other systems reviewed and are negative.      Objective:   Physical Exam    Constitutional: She appears well-developed and well-nourished. No distress.  HENT:  Right Ear: External ear normal.  Left Ear: External ear normal.  Nose: Nose normal.  Mouth/Throat: Oropharynx is clear and moist. No oropharyngeal exudate.  Eyes: Pupils are equal, round, and reactive to light. Conjunctivae and EOM are normal.  Neck: Neck supple. No JVD present. No thyromegaly present.  Cardiovascular: Normal rate, regular rhythm and normal heart sounds.  No murmur heard. Pulmonary/Chest: Effort normal and breath sounds normal. No respiratory distress. She has no wheezes. She has no rales. She exhibits no tenderness.  Abdominal: Soft. Bowel sounds are normal. She exhibits no distension. There is no abdominal tenderness. There is no rebound and no guarding.  Lymphadenopathy:    She has no cervical adenopathy.  Skin: She is not diaphoretic.  Vitals reviewed.  12.5 x 10 cm hematoma on the right anterior lateral shin.  No other bruising is seen anywhere else on the body       Assessment & Plan:  Easy bruising - Plan: CBC with Differential/Platelet, COMPLETE METABOLIC PANEL WITH GFR, PT with INR/Fingerstick  Osteoporosis, unspecified osteoporosis type, unspecified pathological fracture presence - Plan: VITAMIN D 25 Hydroxy (Vit-D Deficiency, Fractures)  I suspect the patient may have injured a vein on her right anterolateral shin unaware to her and that this represents bruising from that.  The bruise is starting to heal.  I feel there are no complications and that no further follow-up is necessary.  However I will check baseline lab work to ensure that there is not an underlying hematologic issue that is causing her to bruise easy such as thrombocytopenia, leukocytosis, elevated liver function test, or events of clotting disorder.

## 2018-06-19 LAB — VITAMIN D 25 HYDROXY (VIT D DEFICIENCY, FRACTURES): VIT D 25 HYDROXY: 8 ng/mL — AB (ref 30–100)

## 2018-06-19 LAB — CBC WITH DIFFERENTIAL/PLATELET
Absolute Monocytes: 660 cells/uL (ref 200–950)
BASOS ABS: 30 {cells}/uL (ref 0–200)
BASOS PCT: 0.3 %
EOS PCT: 1.3 %
Eosinophils Absolute: 130 cells/uL (ref 15–500)
HCT: 37.7 % (ref 35.0–45.0)
HEMOGLOBIN: 12.7 g/dL (ref 11.7–15.5)
Lymphs Abs: 2650 cells/uL (ref 850–3900)
MCH: 30.9 pg (ref 27.0–33.0)
MCHC: 33.7 g/dL (ref 32.0–36.0)
MCV: 91.7 fL (ref 80.0–100.0)
MPV: 10.6 fL (ref 7.5–12.5)
Monocytes Relative: 6.6 %
NEUTROS ABS: 6530 {cells}/uL (ref 1500–7800)
Neutrophils Relative %: 65.3 %
Platelets: 284 10*3/uL (ref 140–400)
RBC: 4.11 10*6/uL (ref 3.80–5.10)
RDW: 13.1 % (ref 11.0–15.0)
Total Lymphocyte: 26.5 %
WBC: 10 10*3/uL (ref 3.8–10.8)

## 2018-06-19 LAB — COMPLETE METABOLIC PANEL WITH GFR
AG Ratio: 2.4 (calc) (ref 1.0–2.5)
ALKALINE PHOSPHATASE (APISO): 31 U/L — AB (ref 37–153)
ALT: 9 U/L (ref 6–29)
AST: 14 U/L (ref 10–35)
Albumin: 4 g/dL (ref 3.6–5.1)
BUN: 8 mg/dL (ref 7–25)
CHLORIDE: 106 mmol/L (ref 98–110)
CO2: 28 mmol/L (ref 20–32)
Calcium: 8.9 mg/dL (ref 8.6–10.4)
Creat: 0.62 mg/dL (ref 0.50–0.99)
GFR, Est African American: 113 mL/min/{1.73_m2} (ref >=60)
GFR, Est Non African American: 97 mL/min/{1.73_m2} (ref >=60)
GLUCOSE: 86 mg/dL (ref 65–99)
Globulin: 1.7 g/dL (calc) — ABNORMAL LOW (ref 1.9–3.7)
Potassium: 4.2 mmol/L (ref 3.5–5.3)
Sodium: 142 mmol/L (ref 135–146)
Total Bilirubin: 0.3 mg/dL (ref 0.2–1.2)
Total Protein: 5.7 g/dL — ABNORMAL LOW (ref 6.1–8.1)

## 2018-06-22 ENCOUNTER — Other Ambulatory Visit: Payer: Self-pay | Admitting: Family Medicine

## 2018-06-22 MED ORDER — VITAMIN D (ERGOCALCIFEROL) 1.25 MG (50000 UNIT) PO CAPS: 50000.0000 [IU] | ORAL_CAPSULE | ORAL | 5 refills | Status: DC

## 2018-12-02 ENCOUNTER — Other Ambulatory Visit: Payer: Self-pay | Admitting: Family Medicine

## 2019-01-05 ENCOUNTER — Other Ambulatory Visit: Payer: Self-pay | Admitting: Family Medicine

## 2019-02-16 ENCOUNTER — Other Ambulatory Visit: Payer: Self-pay

## 2019-02-16 MED ORDER — VITAMIN D (ERGOCALCIFEROL) 1.25 MG (50000 UNIT) PO CAPS: 50000.0000 [IU] | ORAL_CAPSULE | ORAL | 2 refills | Status: DC

## 2019-05-05 ENCOUNTER — Other Ambulatory Visit: Payer: Self-pay | Admitting: Family Medicine

## 2019-08-02 ENCOUNTER — Other Ambulatory Visit: Payer: Self-pay | Admitting: Family Medicine

## 2019-09-20 ENCOUNTER — Other Ambulatory Visit: Payer: Self-pay | Admitting: Family Medicine

## 2020-01-08 ENCOUNTER — Other Ambulatory Visit: Payer: Self-pay | Admitting: Family Medicine

## 2020-04-11 ENCOUNTER — Encounter: Payer: Self-pay | Admitting: *Deleted

## 2020-04-11 ENCOUNTER — Other Ambulatory Visit: Payer: Self-pay

## 2020-04-11 ENCOUNTER — Ambulatory Visit (HOSPITAL_COMMUNITY)
Admission: EM | Admit: 2020-04-11 | Discharge: 2020-04-11 | Disposition: A | Discharge status: Home / Self Care | Payer: BLUE CROSS/BLUE SHIELD | Attending: Emergency Medicine | Admitting: Emergency Medicine

## 2020-04-11 DIAGNOSIS — R079 Chest pain, unspecified: Secondary | ICD-10-CM | POA: Diagnosis not present

## 2020-04-11 NOTE — Progress Notes (Signed)
Pt came to office to schedule appt. Pt mentioned she was having chest pain for the last 2 days. She states it radiates to one arm and she is short of breath. She was made an appt to see [place] for her lower extremities and was advised to go to an urgent care or emergency room for her chest pain. Pt verbalized understanding.

## 2020-04-11 NOTE — ED Triage Notes (Signed)
Pt presents with complaints of chest pain that started in her left arm and is now in to her chest. Describes pain as a dull ache. Reports pain has been present x 2 days. Reports raking leaves a week ago, but no recent physical activity. Hx of aneurysm in her right leg. Endorses mild shortness of breath and fatigue. Reports recent loss of her husband.

## 2020-04-11 NOTE — ED Notes (Signed)
EKG completed and given to Wendee Beavers NP. Pt educated that we cannot fully rule out a heart attack here and she needs further evaluation in the Emergency Department. Pt reports she does not want to go to the Emergency Department and will call her PCP.

## 2020-04-26 ENCOUNTER — Other Ambulatory Visit: Payer: Self-pay | Admitting: Obstetrics & Gynecology

## 2020-04-26 DIAGNOSIS — R928 Other abnormal and inconclusive findings on diagnostic imaging of breast: Secondary | ICD-10-CM

## 2020-05-01 ENCOUNTER — Other Ambulatory Visit: Payer: Self-pay

## 2020-05-01 ENCOUNTER — Other Ambulatory Visit: Payer: Self-pay | Admitting: *Deleted

## 2020-05-01 DIAGNOSIS — I724 Aneurysm of artery of lower extremity: Secondary | ICD-10-CM

## 2020-05-01 DIAGNOSIS — R2990 Unspecified symptoms and signs involving the nervous system: Secondary | ICD-10-CM

## 2020-05-17 ENCOUNTER — Encounter: Payer: Self-pay | Admitting: Physician Assistant

## 2020-05-17 ENCOUNTER — Ambulatory Visit (INDEPENDENT_AMBULATORY_CARE_PROVIDER_SITE_OTHER)
Admission: RE | Admit: 2020-05-17 | Discharge: 2020-05-17 | Disposition: A | Discharge status: Home / Self Care | Payer: BC Managed Care – PPO | Source: Ambulatory Visit | Attending: Vascular Surgery | Admitting: Vascular Surgery

## 2020-05-17 ENCOUNTER — Other Ambulatory Visit: Payer: Self-pay

## 2020-05-17 ENCOUNTER — Ambulatory Visit (HOSPITAL_COMMUNITY)
Admission: RE | Admit: 2020-05-17 | Discharge: 2020-05-17 | Disposition: A | Discharge status: Home / Self Care | Payer: BC Managed Care – PPO | Source: Ambulatory Visit | Attending: Surgery | Admitting: Surgery

## 2020-05-17 ENCOUNTER — Ambulatory Visit: Payer: BC Managed Care – PPO | Admitting: Physician Assistant

## 2020-05-17 VITALS — BP 112/70 | HR 65 | Temp 98.2°F | Resp 20 | Ht 60.0 in | Wt 102.7 lb

## 2020-05-17 DIAGNOSIS — I724 Aneurysm of artery of lower extremity: Secondary | ICD-10-CM

## 2020-05-17 DIAGNOSIS — R2990 Unspecified symptoms and signs involving the nervous system: Secondary | ICD-10-CM

## 2020-05-17 NOTE — Progress Notes (Signed)
Office Note     CC:  follow up Requesting Provider:  Donita Brooks, MD  HPI: Karen Barnett is a 65 y.o. ([birth year]) female who presents for follow up of peripheral artery disease. She has history of right above to below knee popliteal artery bypass for a 4 cm popliteal artery aneurysm with reversed greater saphenous vein by Dr. Hart Rochester on 08/02/14. She has been doing well since her surgery. She denies any claudication symptoms, rest pain or tissue loss. She says very intermittently she will get a tingling sensation in her legs when ambulating. She usually takes an Aspirin when this happens and it goes away. She denies any numbness, weakness or coldness. 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 Presently she does not recall this however the NP Rosalita Chessman had scheduled her for Carotid duplex to further evaluate this. She currently denies any visual changes, amaurosis fugax, slurred speech, facial drooping, weakness of upper or lower extremities. She has no history of TIA or stroke.  She unfortunately reports that her Husband passed away last year in 08/31/19 so she has had some increased stress recently and has had some chest tightness and shortness of breath intermittently  The pt not on a statin for cholesterol management.  The pt is on a daily aspirin.   Other AC: no The pt not on medication for hypertension.   The pt is not diabetic.  Tobacco hx: Former smoker, quit 2008/08/30  Past Medical History:  Diagnosis Date  . Anxiety    situational  . Crohn's colitis (HCC)   . Crohn's disease (HCC)   . Osteoporosis   . Popliteal aneurysm (HCC)    Right    Past Surgical History:  Procedure Laterality Date  . ABDOMINAL AORTAGRAM    . ABDOMINAL AORTAGRAM N/A 08/01/2014   Procedure: ABDOMINAL Ronny Flurry;  Surgeon: Nada Libman, MD;  Location: [facility] CATH LAB;   Service: Cardiovascular;  Laterality: N/A;  . COLON SURGERY    . COLONOSCOPY W/ BIOPSIES AND POLYPECTOMY    . DILATION AND CURETTAGE OF UTERUS    . FEMORAL-POPLITEAL BYPASS GRAFT Right 08/02/2014   Procedure: Resection Right popliteal artery aneurysm with Insertion of Reversed Saphenous Vein Graft Above Knee to Above Knee Popliteal;  Surgeon: Pryor Ochoa, MD;  Location: [facility] OR;  Service: Vascular;  Laterality: Right;  . VEIN HARVEST Right 08/02/2014   Procedure: Saphenous VEIN HARVEST Right Leg;  Surgeon: Pryor Ochoa, MD;  Location: [facility] OR;  Service: Vascular;  Laterality: Right;  . WISDOM TOOTH EXTRACTION      Social History   Socioeconomic History  . Marital status: Married    Spouse name: Not on file  . Number of children: Not on file  . Years of education: Not on file  . Highest education level: Not on file  Occupational History  . Not on file  Tobacco Use  . Smoking status: Former Smoker    Quit date: 02/14/2009    Years since quitting: 11.2  . Smokeless tobacco: Never Used  Vaping Use  . Vaping Use: Never used  Substance and Sexual Activity  . Alcohol use: Yes    Alcohol/week: 4.0 standard drinks    Types: 4 Shots of liquor per week    Comment: occasional   . Drug use: No  . Sexual activity:  Yes  Other Topics Concern  . Not on file  Social History Narrative  . Not on file   Social Determinants of Health   Financial Resource Strain: Not on file  Food Insecurity: Not on file  Transportation Needs: Not on file  Physical Activity: Not on file  Stress: Not on file  Social Connections: Not on file  Intimate Partner Violence: Not on file    Family History  Problem Relation Age of Onset  . Diabetes Father   . Diabetes Brother   . Diabetes Brother     Current Outpatient Medications  Medication Sig Dispense Refill  . aspirin EC 81 MG tablet Take 1 tablet (81 mg total) by mouth daily.    . cyanocobalamin (,VITAMIN B-12,) 1000 MCG/ML injection INJECT 1 ML INTO  THE MUSCLE ONCE A MONTH 3 mL 11  . denosumab (PROLIA) 60 MG/ML SOSY injection Inject 60 mg into the skin every 6 (six) months.    . Vitamin D, Ergocalciferol, (DRISDOL) 1.25 MG (50000 UNIT) CAPS capsule TAKE 1 CAPSULE BY MOUTH EVERY 7 (SEVEN) DAYS. D/C AFTER COURSE. (Patient not taking: Reported on 05/17/2020) 12 capsule 0   No current facility-administered medications for this visit.    No Known Allergies   REVIEW OF SYSTEMS:  [X]  denotes positive finding, [ ]  denotes negative finding Cardiac  Comments:  Chest pain or chest pressure: X   Shortness of breath upon exertion: X   Short of breath when lying flat:    Irregular heart rhythm:        Vascular    Pain in calf, thigh, or hip brought on by ambulation:    Pain in feet at night that wakes you up from your sleep:     Blood clot in your veins:    Leg swelling:         Pulmonary    Oxygen at home:    Productive cough:     Wheezing:         Neurologic    Sudden weakness in arms or legs:     Sudden numbness in arms or legs:     Sudden onset of difficulty speaking or slurred speech:    Temporary loss of vision in one eye:     Problems with dizziness:         Gastrointestinal    Blood in stool:     Vomited blood:         Genitourinary    Burning when urinating:     Blood in urine:        Psychiatric    Major depression:         Hematologic    Bleeding problems:    Problems with blood clotting too easily:        Skin    Rashes or ulcers:        Constitutional    Fever or chills:      PHYSICAL EXAMINATION:  Vitals:   05/17/20 1515 05/17/20 1517  BP: 119/67 112/70  Pulse: 65   Resp: 20   Temp: 98.2 F (36.8 C)   TempSrc: Temporal   SpO2: 98%   Weight: 102 lb 11.2 oz (46.6 kg)   Height: 5' (1.524 m)     General:  WDWN in NAD; vital signs documented above Gait: Normal HENT: WNL, normocephalic Pulmonary: normal non-labored breathing , without wheezing Cardiac: regular HR, without  Murmurs without  carotid bruit Abdomen: soft, NT, no masses Vascular Exam/Pulses:  Right Left  Radial 2+ (normal) 2+ (normal)  Femoral 2+ (normal) 2+ (normal)  Popliteal 2+ (normal) 2+ (normal)  DP 2+ (normal) 2+ (normal)  PT 1+ (weak) 1+ (weak)   Extremities: without ischemic changes, without Gangrene , without cellulitis; without open wounds; lower extremities warm. Motor and sensation intact  Musculoskeletal: no muscle wasting or atrophy  Neurologic: A&O X 3;  No focal weakness or paresthesias are detected Psychiatric:  The pt has Normal affect.   Non-Invasive Vascular Imaging:  05/17/20 +-------+-----------+-----------+------------+------------+  ABI/TBIToday's ABIToday's TBIPrevious ABIPrevious TBI  +-------+-----------+-----------+------------+------------+  Right 1.05    0.71    1.21    0.82      +-------+-----------+-----------+------------+------------+  Left  1.00    0.58    1.36    0.86      +-------+-----------+-----------+------------+------------+   Right lower extremity arterial duplex shows patent right above knee to below knee popliteal bypass graft  Carotid Duplex: Right Carotid: There is no evidence of stenosis in the right ICA. Left Carotid: There is no evidence of stenosis in the left ICA. Vertebrals: Bilateral vertebral arteries demonstrate antegrade flow.  ASSESSMENT/PLAN:: 65 y.o. female here for follow up for peripheral artery disease. She has history of a right above knee to below knee popliteal vein bypass for a popliteal aneurysm. This was done in 2016 by Dr. Hart Rochester. She has been without symptoms since. Her bypass duplex today shows a patent bypass with triphasic flow throughout. - Slight decrease in left TBI from last study however she has triphasic waveforms bilaterally and her ABIs are essentially unchanged - She remains on Aspirin daily - She will follow up in 18 months with repeat ABIs and RLE bypass graft duplex She  is not having any new neurological symptoms. She underwent a screening carotid duplex today. -Carotid duplex today shows no evidence of stenosis in either ICA. Normal bilateral vertebral arteries with antegrade flow and no subclavian artery stenosis - She will not need to have repeat carotid duplex  Graceann Congress, PA-C [facility] [phone]  Clinic MD:  Dr.Dickson

## 2020-05-23 ENCOUNTER — Ambulatory Visit: Payer: BC Managed Care – PPO

## 2020-05-23 ENCOUNTER — Ambulatory Visit
Admission: RE | Admit: 2020-05-23 | Discharge: 2020-05-23 | Disposition: A | Discharge status: Home / Self Care | Payer: BC Managed Care – PPO | Source: Ambulatory Visit | Attending: Obstetrics & Gynecology | Admitting: Obstetrics & Gynecology

## 2020-05-23 ENCOUNTER — Other Ambulatory Visit: Payer: Self-pay

## 2020-05-23 DIAGNOSIS — R928 Other abnormal and inconclusive findings on diagnostic imaging of breast: Secondary | ICD-10-CM

## 2021-02-27 ENCOUNTER — Other Ambulatory Visit: Payer: Self-pay | Admitting: Family Medicine

## 2021-09-06 IMAGING — MG MM DIGITAL DIAGNOSTIC UNILAT*L* W/ TOMO W/ CAD
8 series · 9 of 20 positions shown · non-contrast
Comparison: Previous exam(s).

CLINICAL DATA: 64-year-old female recalled from screening mammogram
dated 04/19/2020 for a possible left breast asymmetry with
associated calcifications.

EXAM:
DIGITAL DIAGNOSTIC UNILATERAL LEFT MAMMOGRAM WITH TOMO AND CAD
TECHNIQUE: Left digital diagnostic mammography and breast tomosynthesis was
performed. Digital images of the breasts were evaluated with
computer-aided detection.

[L CC]
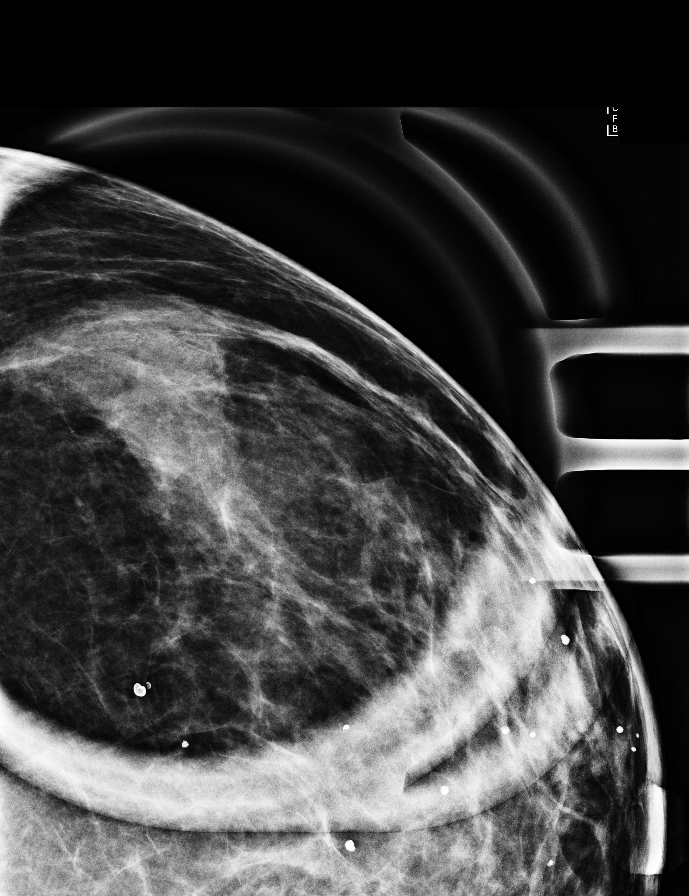

[L ML]
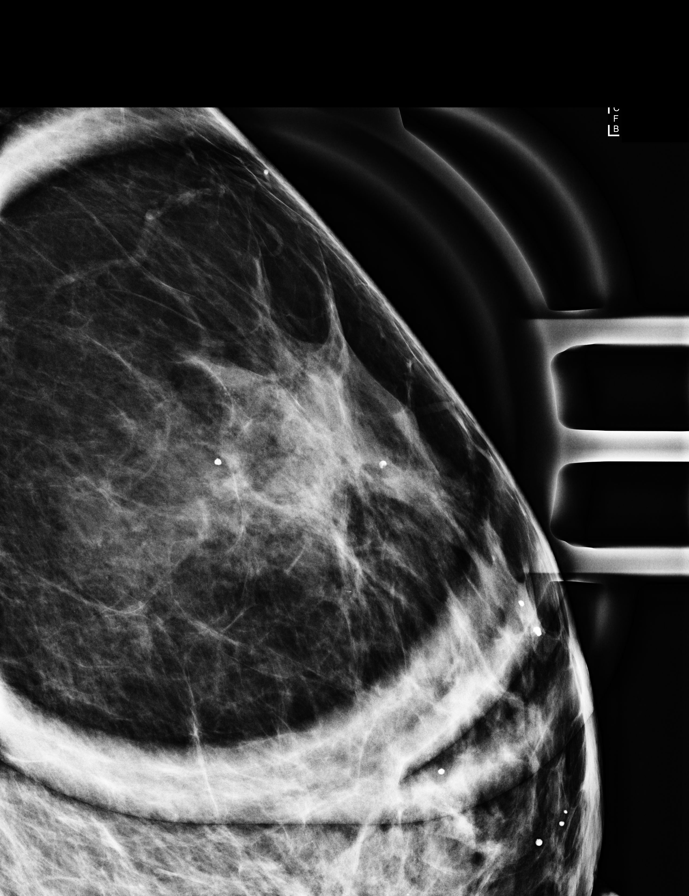

[L MLO synth-2D]
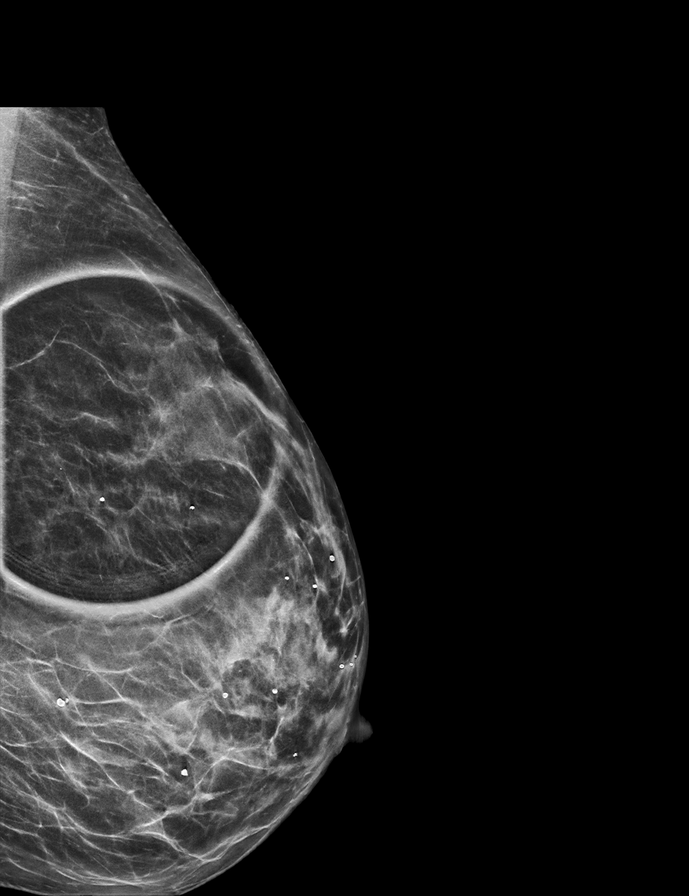

[L CC synth-2D]
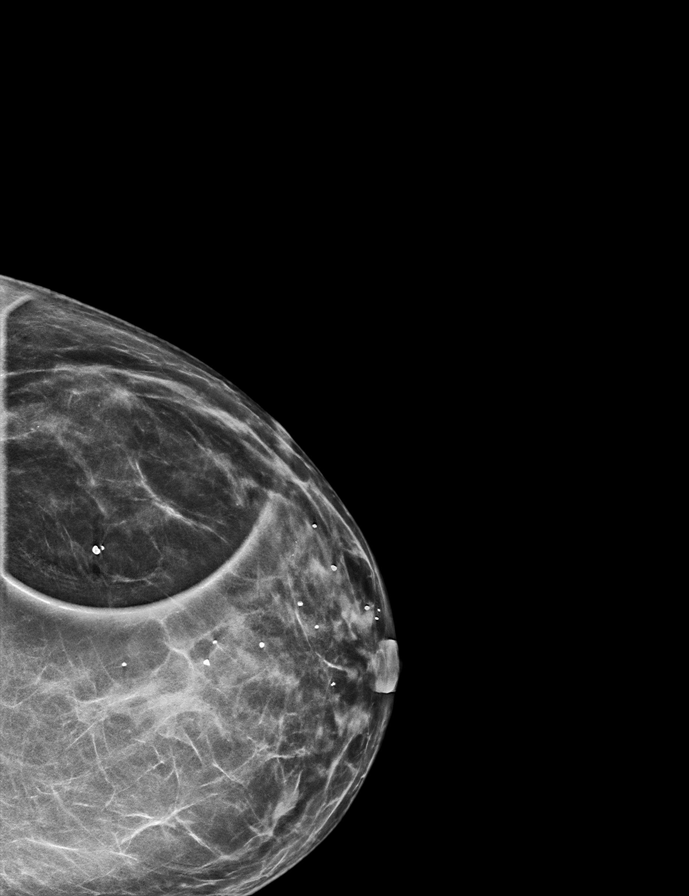

[L ML synth-2D]
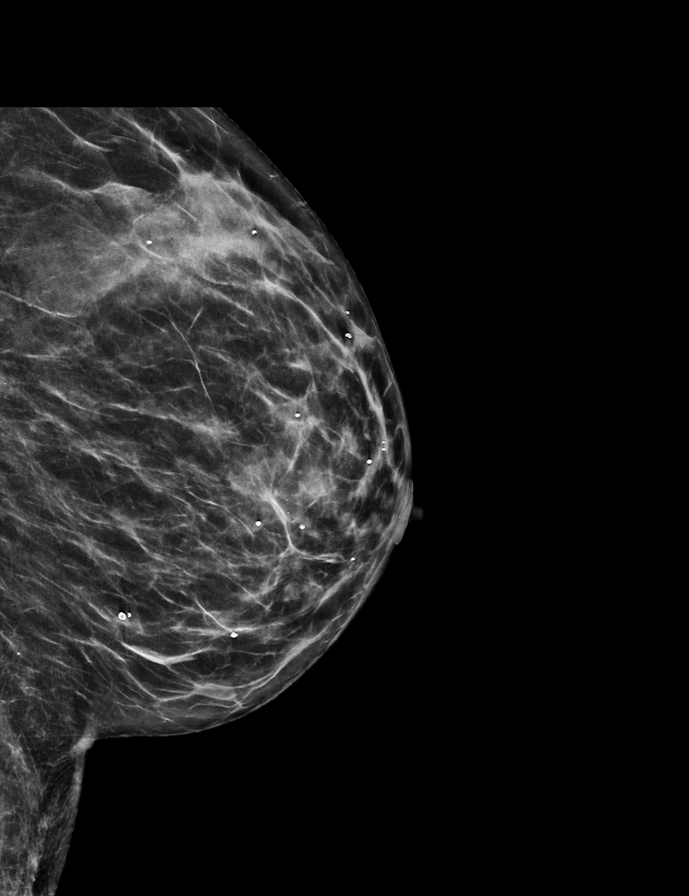

[L MLO tomo · 2 of 53 frames shown]
[frame 18/53]
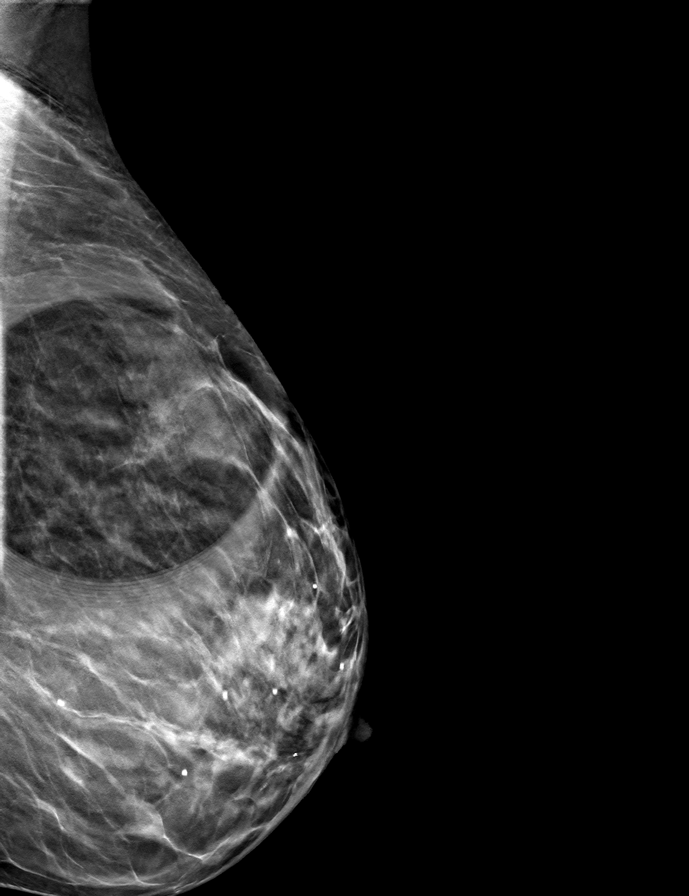
[frame 27/53]
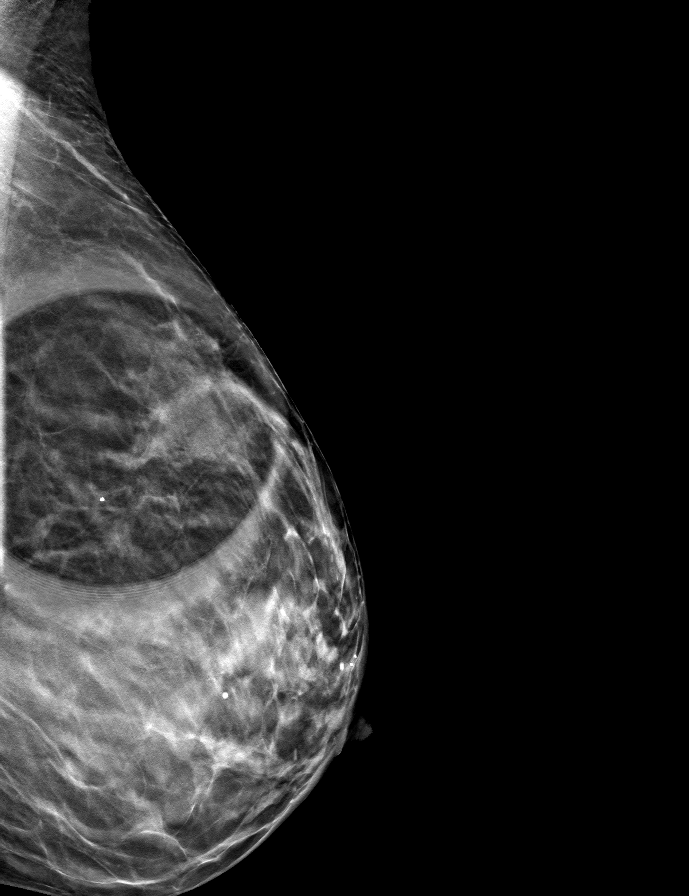

[L CC tomo · tomo slice 29/58.0]
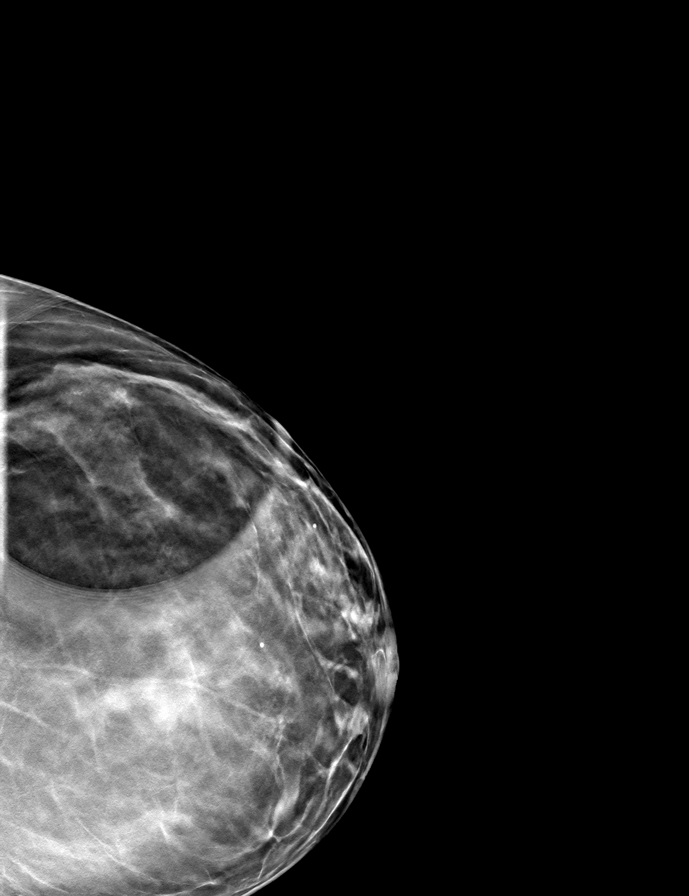

[L ML tomo · tomo slice 30/59.0]
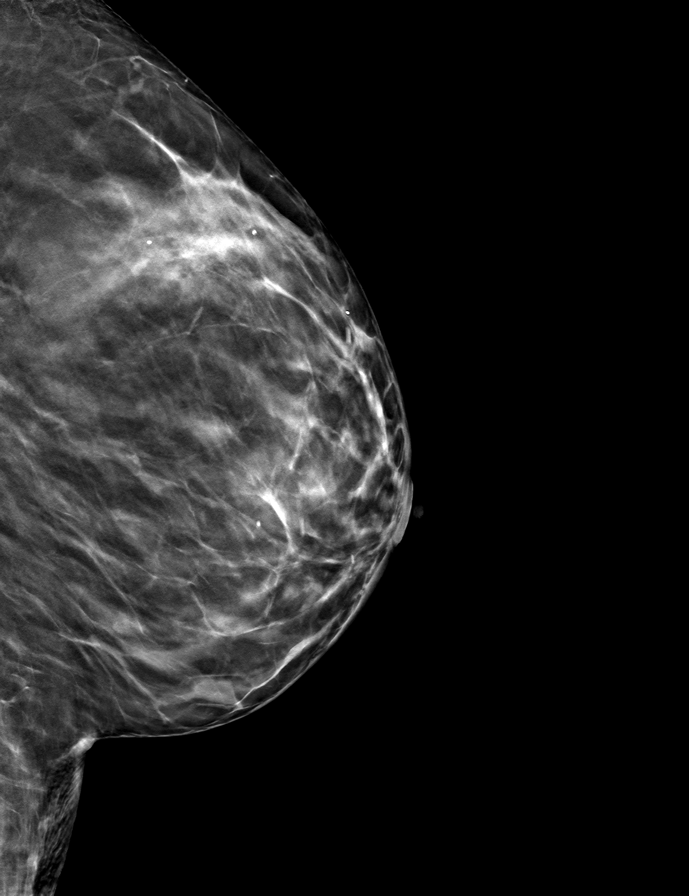

[9 of 20 positions shown; findings below may reference images not displayed]

ACR Breast Density Category c: The breast tissue is heterogeneously
dense, which may obscure small masses.
FINDINGS: Previously described, possible asymmetry in the upper outer left
breast at posterior depth does not persist on today's additional
views. Scattered coarse calcifications are identified, but no
persistent calcifications in this area are noted.
IMPRESSION: No mammographic evidence of malignancy in the left breast.

RECOMMENDATION:
Screening mammogram in one year.(Code:J4-3-5XO)

I have discussed the findings and recommendations with the patient.
If applicable, a reminder letter will be sent to the patient
regarding the next appointment.

BI-RADS CATEGORY  2: Benign.

## 2021-11-26 ENCOUNTER — Encounter: Payer: Self-pay | Admitting: Plastic Surgery

## 2021-11-26 ENCOUNTER — Ambulatory Visit (INDEPENDENT_AMBULATORY_CARE_PROVIDER_SITE_OTHER): Payer: BC Managed Care – PPO | Admitting: Plastic Surgery

## 2021-11-26 VITALS — BP 115/62 | HR 70 | Ht 59.0 in | Wt 103.0 lb

## 2021-11-26 DIAGNOSIS — M545 Low back pain, unspecified: Secondary | ICD-10-CM

## 2021-11-26 DIAGNOSIS — G8929 Other chronic pain: Secondary | ICD-10-CM

## 2021-11-26 DIAGNOSIS — M546 Pain in thoracic spine: Secondary | ICD-10-CM | POA: Diagnosis not present

## 2021-11-26 DIAGNOSIS — M542 Cervicalgia: Secondary | ICD-10-CM

## 2021-11-26 DIAGNOSIS — Z6820 Body mass index (BMI) 20.0-20.9, adult: Secondary | ICD-10-CM

## 2021-11-26 DIAGNOSIS — N62 Hypertrophy of breast: Secondary | ICD-10-CM | POA: Diagnosis not present

## 2021-11-26 NOTE — Progress Notes (Signed)
Referring Provider Donita Brooks, MD [address] [place],  [place] [address]   CC:  Back pain and breast hypertrophy  Karen Barnett is an 66 y.o. female.  HPI:   The patient is a 66 y.o. female with a history of mammary hyperplasia for several years.  She has extremely large breasts causing symptoms that include the following: Back pain in the upper and lower back, including neck pain. She pulls or pins her bra straps to provide better lift and relief of the pressure and pain. She notices relief by holding her breast up manually.  Her shoulder straps cause grooves and pain and pressure that requires padding for relief. Pain medication is sometimes required with motrin and tylenol.  Activities that are hindered by enlarged breasts include: exercise and running.  She has tried supportive clothing as well as fitted bras without improvement.     [facility] family history of breast cancer: No.  Tobacco use: Former smoker has not smoked for 15 years.   The patient expresses the desire to pursue surgical intervention.  Patient does have some osteoporosis and does currently take medications for that.  No history of diabetes and no blood thinners  The BMI = 20.  Preoperative bra size = DD cup.   No Known Allergies  Outpatient Encounter Medications as of 11/26/2021  Medication Sig   aspirin EC 81 MG tablet Take 1 tablet (81 mg total) by mouth daily.   cyanocobalamin (,VITAMIN B-12,) 1000 MCG/ML injection INJECT 1 ML INTO THE MUSCLE ONCE A MONTH   denosumab (PROLIA) 60 MG/ML SOSY injection Inject 60 mg into the skin every 6 (six) months.   Vitamin D, Ergocalciferol, (DRISDOL) 1.25 MG (50000 UNIT) CAPS capsule TAKE 1 CAPSULE BY MOUTH EVERY 7 (SEVEN) DAYS. D/C AFTER COURSE. (Patient not taking: Reported on 05/17/2020)   No facility-administered encounter medications on file as of 11/26/2021.     Past Medical History:  Diagnosis Date   Anxiety     situational   Crohn's colitis (HCC)    Crohn's disease (HCC)    Osteoporosis    Popliteal aneurysm (HCC)    Right    Past Surgical History:  Procedure Laterality Date   ABDOMINAL AORTAGRAM     ABDOMINAL AORTAGRAM N/A 08/01/2014   Procedure: ABDOMINAL Ronny Flurry;  Surgeon: Nada Libman, MD;  Location: [facility] CATH LAB;  Service: Cardiovascular;  Laterality: N/A;   COLON SURGERY     COLONOSCOPY W/ BIOPSIES AND POLYPECTOMY     DILATION AND CURETTAGE OF UTERUS     FEMORAL-POPLITEAL BYPASS GRAFT Right 08/02/2014   Procedure: Resection Right popliteal artery aneurysm with Insertion of Reversed Saphenous Vein Graft Above Knee to Above Knee Popliteal;  Surgeon: Pryor Ochoa, MD;  Location: [facility] OR;  Service: Vascular;  Laterality: Right;   VEIN HARVEST Right 08/02/2014   Procedure: Saphenous VEIN HARVEST Right Leg;  Surgeon: Pryor Ochoa, MD;  Location: [facility] OR;  Service: Vascular;  Laterality: Right;   WISDOM TOOTH EXTRACTION      Family History  Problem Relation Age of Onset   Diabetes Father    Diabetes Brother    Diabetes Brother     Social History   Social History Narrative   Not on file     Review of Systems General: Denies fevers, chills, weight loss CV: Denies chest pain, shortness of breath, palpitations   Physical Exam    11/26/2021    3:01  PM 05/17/2020    3:17 PM 05/17/2020    3:15 PM  Vitals with BMI  Height 4\' 11"   5\' 0"   Weight 103 lbs  102 lbs 11 oz  BMI 20.79  20.06  Systolic 115 112  Diastolic 62 70 67  Pulse 70  65    General:  No acute distress,  Alert and oriented, Non-Toxic, Normal speech and affect Breast: No easily palpable breast masses on physical exam, significant breast ptosis and macromastia. Her breasts are extremely large and fairly symmetric with the right slightly larger.  She has hyperpigmentation of the inframammary area on both sides.  The sternal to nipple distance on the right is 28 cm and the left is 26 cm.  The IMF distance is 8  cm on the right and 8 cm on the left.  Base width is 14 bilaterally. Assessment/Plan   The patient has bilateral symptomatic macromastia.  She is a good candidate for a breast reduction.  We will refer her for physical therapy which is likely her insurance requirement for her.  She is interested in pursuing surgical treatment.  She has tried supportive garments and fitted bras with no relief.  The details of breast reduction surgery were discussed.  I explained the procedure in detail along the with the expected scars.  The risks were discussed in detail and include bleeding, infection, damage to surrounding structures, need for additional procedures, nipple loss, change in nipple sensation, persistent pain, contour irregularities and asymmetries.  I explained that breast feeding is often not possible after breast reduction surgery.  We discussed the expected postoperative course with an overall recovery period of about 1 month.  She demonstrated full understanding of all risks.    The patient is interested in pursuing surgical treatment.  The estimated excess breast tissue to be removed at the time of surgery = 225 grams on the left and 225 grams on the right. 11/26/2021, 3:29 PM

## 2021-11-27 ENCOUNTER — Telehealth: Payer: Self-pay

## 2021-11-27 NOTE — Telephone Encounter (Signed)
Faxed medical records request for mammogram results from [facility]. Received confirmation fax. Original document forwarded to front desk for batch scanning.

## 2021-12-10 ENCOUNTER — Other Ambulatory Visit: Payer: Self-pay | Admitting: *Deleted

## 2021-12-10 DIAGNOSIS — I739 Peripheral vascular disease, unspecified: Secondary | ICD-10-CM

## 2021-12-10 DIAGNOSIS — I724 Aneurysm of artery of lower extremity: Secondary | ICD-10-CM

## 2021-12-19 ENCOUNTER — Ambulatory Visit (HOSPITAL_COMMUNITY)
Admission: RE | Admit: 2021-12-19 | Discharge: 2021-12-19 | Disposition: A | Discharge status: Home / Self Care | Payer: BC Managed Care – PPO | Source: Ambulatory Visit | Attending: Vascular Surgery | Admitting: Vascular Surgery

## 2021-12-19 ENCOUNTER — Ambulatory Visit (INDEPENDENT_AMBULATORY_CARE_PROVIDER_SITE_OTHER)
Admission: RE | Admit: 2021-12-19 | Discharge: 2021-12-19 | Disposition: A | Discharge status: Home / Self Care | Payer: BC Managed Care – PPO | Source: Ambulatory Visit | Attending: Vascular Surgery | Admitting: Vascular Surgery

## 2021-12-19 DIAGNOSIS — I724 Aneurysm of artery of lower extremity: Secondary | ICD-10-CM

## 2021-12-19 DIAGNOSIS — I739 Peripheral vascular disease, unspecified: Secondary | ICD-10-CM

## 2021-12-23 ENCOUNTER — Ambulatory Visit: Payer: BC Managed Care – PPO | Admitting: Surgery

## 2021-12-23 ENCOUNTER — Encounter: Payer: Self-pay | Admitting: Surgery

## 2021-12-23 VITALS — BP 104/65 | HR 70 | Temp 97.9°F | Resp 18 | Ht 59.0 in | Wt 102.0 lb

## 2021-12-23 DIAGNOSIS — I724 Aneurysm of artery of lower extremity: Secondary | ICD-10-CM | POA: Diagnosis not present

## 2021-12-23 NOTE — Progress Notes (Signed)
[facility] [place]  Patient name: Karen Barnett MRN: [id number] DOB: [birth year] Sex: female   REASON FOR VISIT:    Follow up  HISOTRY OF PRESENT ILLNESS:    Karen Barnett is a 66 y.o. female who is status post repair of right popliteal artery aneurysm via an above-knee to below-knee popliteal bypass with reverse saphenous vein by Dr. Hart Rochester on 08/02/2014.  This was done for 4 cm aneurysm.  She has no complaints today.  The patient is a former smoker.  She suffers from Crohn's disease.  She takes an aspirin.   PAST MEDICAL HISTORY:   Past Medical History:  Diagnosis Date   Anxiety    situational   Crohn's colitis (HCC)    Crohn's disease (HCC)    Osteoporosis    Popliteal aneurysm (HCC)    Right     FAMILY HISTORY:   Family History  Problem Relation Age of Onset   Diabetes Father    Diabetes Brother    Diabetes Brother     SOCIAL HISTORY:   Social History   Tobacco Use   Smoking status: Former    Types: Cigarettes    Quit date: 02/14/2009    Years since quitting: 12.8   Smokeless tobacco: Never  Substance Use Topics   Alcohol use: Yes    Alcohol/week: 4.0 standard drinks of alcohol    Types: 4 Shots of liquor per week    Comment: occasional      ALLERGIES:   No Known Allergies   CURRENT MEDICATIONS:   Current Outpatient Medications  Medication Sig Dispense Refill   aspirin EC 81 MG tablet Take 1 tablet (81 mg total) by mouth daily.     cyanocobalamin (,VITAMIN B-12,) 1000 MCG/ML injection INJECT 1 ML INTO THE MUSCLE ONCE A MONTH 3 mL 11   denosumab (PROLIA) 60 MG/ML SOSY injection Inject 60 mg into the skin every 6 (six) months.     No current facility-administered medications for this visit.    REVIEW OF SYSTEMS:   [X]  denotes positive finding, [ ]  denotes negative finding Cardiac  Comments:  Chest pain or chest pressure:    Shortness of breath upon exertion:    Short of  breath when lying flat:    Irregular heart rhythm:        Vascular    Pain in calf, thigh, or hip brought on by ambulation:    Pain in feet at night that wakes you up from your sleep:     Blood clot in your veins:    Leg swelling:         Pulmonary    Oxygen at home:    Productive cough:     Wheezing:         Neurologic    Sudden weakness in arms or legs:     Sudden numbness in arms or legs:     Sudden onset of difficulty speaking or slurred speech:    Temporary loss of vision in one eye:     Problems with dizziness:         Gastrointestinal    Blood in stool:     Vomited blood:         Genitourinary    Burning when urinating:     Blood in urine:        Psychiatric    Major depression:         Hematologic    Bleeding problems:  Problems with blood clotting too easily:        Skin    Rashes or ulcers:        Constitutional    Fever or chills:      PHYSICAL EXAM:   Vitals:   12/23/21 1408  BP: 104/65  Pulse: 70  Resp: 18  Temp: 97.9 F (36.6 C)  SpO2: 96%  Weight: 102 lb (46.3 kg)  Height: 4\' 11"  (1.499 m)    GENERAL: The patient is a well-nourished female, in no acute distress. The vital signs are documented above. CARDIAC: There is a regular rate and rhythm.  VASCULAR: Palpable dorsalis pedis pulse.  Trace edema bilaterally PULMONARY: Non-labored respirations MUSCULOSKELETAL: There are no major deformities or cyanosis. NEUROLOGIC: No focal weakness or paresthesias are detected. SKIN: There are no ulcers or rashes noted. PSYCHIATRIC: The patient has a normal affect.  STUDIES:   I have reviewed the following: +-------+-----------+-----------+------------+------------+  ABI/TBIToday's ABIToday's TBIPrevious ABIPrevious TBI  +-------+-----------+-----------+------------+------------+  Right  1.16       0.90       1.05        0.71          +-------+-----------+-----------+------------+------------+  Left   1.23       0.57        1.00        0.58          +-------+-----------+-----------+------------+------------+  Right toe pressure: 103 Left toe pressure:65    Right: Patent above knee to below knee bypass graft with no visualized  stenosis.      MEDICAL ISSUES:   Bypass graft is widely patent by ultrasound and she has a palpable pulse with no lower extremity vascular insufficiency symptoms.  She will continue with surveillance.  I will get her next ultrasound in 2 years.    , MD, FACS [facility] [phone] Pager [phone]

## 2021-12-27 ENCOUNTER — Telehealth: Payer: Self-pay | Admitting: Plastic Surgery

## 2021-12-27 NOTE — Telephone Encounter (Signed)
Patient returned call she missed yesterday; stated the message wasn't audible and she's unsure about why she was contacted.  Patient stated she's ready to schedule sx and is waiting for instructions of what to do next.  Requesting call back first part of next week (headed out to do some shopping).   Please advise at [phone].

## 2021-12-27 NOTE — Telephone Encounter (Signed)
Returned patients call. Advised I am working on her PA currently. Inquired if she has 6 weeks of PT.  If so, please have the facility fax all the notes. If note, we need to set up a referral.

## 2021-12-31 ENCOUNTER — Other Ambulatory Visit: Payer: Self-pay

## 2021-12-31 ENCOUNTER — Telehealth: Payer: Self-pay

## 2021-12-31 DIAGNOSIS — G8929 Other chronic pain: Secondary | ICD-10-CM

## 2021-12-31 DIAGNOSIS — N62 Hypertrophy of breast: Secondary | ICD-10-CM

## 2021-12-31 NOTE — Telephone Encounter (Signed)
Faxed referral, demographics, insurance card and notes for PT 6 weeks to [facility] in [place]. Called patient, LMVM and advised referral was sent.

## 2022-01-08 ENCOUNTER — Telehealth: Payer: Self-pay

## 2022-01-08 NOTE — Telephone Encounter (Signed)
Patient called and advised [facility] did not receive the referral. Refaxed

## 2022-02-12 ENCOUNTER — Other Ambulatory Visit: Payer: BC Managed Care – PPO

## 2022-02-12 ENCOUNTER — Telehealth: Payer: Self-pay

## 2022-02-12 DIAGNOSIS — I724 Aneurysm of artery of lower extremity: Secondary | ICD-10-CM

## 2022-02-12 NOTE — Telephone Encounter (Signed)
Called patient, LMVM if PT is completed PT. Called 9/20-had 3 more visits out of 6

## 2022-02-13 ENCOUNTER — Telehealth: Payer: Self-pay | Admitting: *Deleted

## 2022-02-13 LAB — COMPREHENSIVE METABOLIC PANEL
AG Ratio: 2.2 (calc) (ref 1.0–2.5)
ALT: 12 U/L (ref 6–29)
AST: 13 U/L (ref 10–35)
Albumin: 4.3 g/dL (ref 3.6–5.1)
Alkaline phosphatase (APISO): 34 U/L — ABNORMAL LOW (ref 37–153)
BUN: 12 mg/dL (ref 7–25)
CO2: 31 mmol/L (ref 20–32)
Calcium: 9.4 mg/dL (ref 8.6–10.4)
Chloride: 105 mmol/L (ref 98–110)
Creat: 0.69 mg/dL (ref 0.50–1.05)
Globulin: 2 g/dL (calc) (ref 1.9–3.7)
Glucose, Bld: 92 mg/dL (ref 65–99)
Potassium: 4.7 mmol/L (ref 3.5–5.3)
Sodium: 144 mmol/L (ref 135–146)
Total Bilirubin: 0.2 mg/dL (ref 0.2–1.2)
Total Protein: 6.3 g/dL (ref 6.1–8.1)

## 2022-02-13 LAB — CBC WITH DIFFERENTIAL/PLATELET
Absolute Monocytes: 919 cells/uL (ref 200–950)
Basophils Absolute: 27 cells/uL (ref 0–200)
Basophils Relative: 0.3 %
Eosinophils Absolute: 173 cells/uL (ref 15–500)
Eosinophils Relative: 1.9 %
HCT: 40.8 % (ref 35.0–45.0)
Hemoglobin: 13.6 g/dL (ref 11.7–15.5)
Lymphs Abs: 3376 cells/uL (ref 850–3900)
MCH: 31.3 pg (ref 27.0–33.0)
MCHC: 33.3 g/dL (ref 32.0–36.0)
MCV: 94 fL (ref 80.0–100.0)
MPV: 10 fL (ref 7.5–12.5)
Monocytes Relative: 10.1 %
Neutro Abs: 4605 cells/uL (ref 1500–7800)
Neutrophils Relative %: 50.6 %
Platelets: 346 10*3/uL (ref 140–400)
RBC: 4.34 10*6/uL (ref 3.80–5.10)
RDW: 13 % (ref 11.0–15.0)
Total Lymphocyte: 37.1 %
WBC: 9.1 10*3/uL (ref 3.8–10.8)

## 2022-02-13 LAB — LIPID PANEL
Cholesterol: 221 mg/dL — ABNORMAL HIGH (ref <200)
HDL: 80 mg/dL (ref >=50)
LDL Cholesterol (Calc): 123 mg/dL (calc) — ABNORMAL HIGH
Non-HDL Cholesterol (Calc): 141 mg/dL (calc) — ABNORMAL HIGH (ref <130)
Total CHOL/HDL Ratio: 2.8 (calc) (ref <5.0)
Triglycerides: 84 mg/dL (ref <150)

## 2022-02-14 ENCOUNTER — Ambulatory Visit (INDEPENDENT_AMBULATORY_CARE_PROVIDER_SITE_OTHER): Payer: BC Managed Care – PPO | Admitting: Family Medicine

## 2022-02-14 ENCOUNTER — Telehealth: Payer: Self-pay | Admitting: *Deleted

## 2022-02-14 ENCOUNTER — Encounter: Payer: Self-pay | Admitting: Family Medicine

## 2022-02-14 VITALS — BP 110/62 | HR 73 | Ht 59.0 in | Wt 102.4 lb

## 2022-02-14 DIAGNOSIS — Z23 Encounter for immunization: Secondary | ICD-10-CM | POA: Diagnosis not present

## 2022-02-14 DIAGNOSIS — K5010 Crohn's disease of large intestine without complications: Secondary | ICD-10-CM

## 2022-02-14 DIAGNOSIS — Z0000 Encounter for general adult medical examination without abnormal findings: Secondary | ICD-10-CM

## 2022-02-14 NOTE — Progress Notes (Signed)
Subjective:    Patient ID: Karen Barnett, female    DOB: 1955-05-13, 66 y.o.   MRN: [id number]  Patient is a very sweet 66 year old Caucasian female who is here today for physical exam.  Her gastroenterologist is Dr. Kinnie Scales.  She states that she had a colonoscopy a year ago that was normal.  She had a Pap smear earlier this year at the gynecologist.  She had a mammogram in May that was normal.  She has osteoporosis and she is on Prolia and this is managed by her GYN.  Her most recent lab work is listed below.  She is due for a flu shot, COVID booster, RSV. Lab on 02/12/2022  Component Date Value Ref Range Status   Cholesterol 02/12/2022 221 (H)  <200 mg/dL Final   HDL 37/90/2409 80  > OR = 50 mg/dL Final   Triglycerides 73/53/2992 84  <150 mg/dL Final   LDL Cholesterol (Calc) 02/12/2022 123 (H)  mg/dL (calc) Final   Comment: Reference range: <100 . Desirable range <100 mg/dL for primary prevention;   <70 mg/dL for patients with CHD or diabetic patients  with > or = 2 CHD risk factors. Marland Kitchen LDL-C is now calculated using the Martin-Hopkins  calculation, which is a validated novel method providing  better accuracy than the Friedewald equation in the  estimation of LDL-C.  Horald Pollen et al. Lenox Ahr. 4268;341(96): 2061-2068  (http://education.Quest[employer].com/faq/FAQ164)    Total CHOL/HDL Ratio 02/12/2022 2.8  <2.2 (calc) Final   Non-HDL Cholesterol (Calc) 02/12/2022 141 (H)  <130 mg/dL (calc) Final   Comment: For patients with diabetes plus 1 major ASCVD risk  factor, treating to a non-HDL-C goal of <100 mg/dL  (LDL-C of <29 mg/dL) is considered a therapeutic  option.    Glucose, Bld 02/12/2022 92  65 - 99 mg/dL Final   Comment: .            Fasting reference interval .    BUN 02/12/2022 12  7 - 25 mg/dL Final   Creat 79/89/2119 0.69  0.50 - 1.05 mg/dL Final   BUN/Creatinine Ratio 02/12/2022 SEE NOTE:  6 - 22 (calc) Final   Comment:    Not Reported: BUN and Creatinine are within     reference range. .    Sodium 02/12/2022 144  135 - 146 mmol/L Final   Potassium 02/12/2022 4.7  3.5 - 5.3 mmol/L Final   Chloride 02/12/2022 105  98 - 110 mmol/L Final   CO2 02/12/2022 31  20 - 32 mmol/L Final   Calcium 02/12/2022 9.4  8.6 - 10.4 mg/dL Final   Total Protein 41/74/0814 6.3  6.1 - 8.1 g/dL Final   Albumin 48/18/5631 4.3  3.6 - 5.1 g/dL Final   Globulin 49/70/2637 2.0  1.9 - 3.7 g/dL (calc) Final   AG Ratio 02/12/2022 2.2  1.0 - 2.5 (calc) Final   Total Bilirubin 02/12/2022 0.2  0.2 - 1.2 mg/dL Final   Alkaline phosphatase (APISO) 02/12/2022 34 (L)  37 - 153 U/L Final   AST 02/12/2022 13  10 - 35 U/L Final   ALT 02/12/2022 12  6 - 29 U/L Final   WBC 02/12/2022 9.1  3.8 - 10.8 Thousand/uL Final   RBC 02/12/2022 4.34  3.80 - 5.10 Million/uL Final   Hemoglobin 02/12/2022 13.6  11.7 - 15.5 g/dL Final   HCT 85/88/5027 40.8  35.0 - 45.0 % Final   MCV 02/12/2022 94.0  80.0 - 100.0 fL Final   MCH 02/12/2022 31.3  27.0 - 33.0 pg Final   MCHC 02/12/2022 33.3  32.0 - 36.0 g/dL Final   RDW 02/12/2022 13.0  11.0 - 15.0 % Final   Platelets 02/12/2022 346  140 - 400 Thousand/uL Final   MPV 02/12/2022 10.0  7.5 - 12.5 fL Final   Neutro Abs 02/12/2022 4,605  1,500 - 7,800 cells/uL Final   Lymphs Abs 02/12/2022 3,376  850 - 3,900 cells/uL Final   Absolute Monocytes 02/12/2022 919  200 - 950 cells/uL Final   Eosinophils Absolute 02/12/2022 173  15 - 500 cells/uL Final   Basophils Absolute 02/12/2022 27  0 - 200 cells/uL Final   Neutrophils Relative % 02/12/2022 50.6  % Final   Total Lymphocyte 02/12/2022 37.1  % Final   Monocytes Relative 02/12/2022 10.1  % Final   Eosinophils Relative 02/12/2022 1.9  % Final   Basophils Relative 02/12/2022 0.3  % Final   Since I last saw the patient, she has lost both her husband and her father.  However she denies depression and she states that she is making it 1 day at a time. Past Medical History:  Diagnosis Date   Anxiety    situational    Crohn's colitis ([place])    Crohn's disease ([place])    Osteoporosis    Popliteal aneurysm ([place])    Right   Past Surgical History:  Procedure Laterality Date   ABDOMINAL AORTAGRAM     ABDOMINAL AORTAGRAM N/A 08/01/2014   Procedure: ABDOMINAL Maxcine Ham;  Surgeon: Serafina Mitchell, MD;  Location: [facility] CATH LAB;  Service: Cardiovascular;  Laterality: N/A;   COLON SURGERY     COLONOSCOPY W/ BIOPSIES AND POLYPECTOMY     DILATION AND CURETTAGE OF UTERUS     FEMORAL-POPLITEAL BYPASS GRAFT Right 08/02/2014   Procedure: Resection Right popliteal artery aneurysm with Insertion of Reversed Saphenous Vein Graft Above Knee to Above Knee Popliteal;  Surgeon: Mal Misty, MD;  Location: [place];  Service: Vascular;  Laterality: Right;   VEIN HARVEST Right 08/02/2014   Procedure: Saphenous VEIN HARVEST Right Leg;  Surgeon: Mal Misty, MD;  Location: [place];  Service: Vascular;  Laterality: Right;   WISDOM TOOTH EXTRACTION     Current Outpatient Medications on File Prior to Visit  Medication Sig Dispense Refill   aspirin EC 81 MG tablet Take 1 tablet (81 mg total) by mouth daily.     cyanocobalamin (,VITAMIN B-12,) 1000 MCG/ML injection INJECT 1 ML INTO THE MUSCLE ONCE A MONTH 3 mL 11   denosumab (PROLIA) 60 MG/ML SOSY injection Inject 60 mg into the skin every 6 (six) months.     No current facility-administered medications on file prior to visit.   No Known Allergies Social History   Socioeconomic History   Marital status: Widowed    Spouse name: Not on file   Number of children: Not on file   Years of education: Not on file   Highest education level: Not on file  Occupational History   Not on file  Tobacco Use   Smoking status: Former    Types: Cigarettes    Quit date: 02/14/2009    Years since quitting: 13.0   Smokeless tobacco: Never  Vaping Use   Vaping Use: Never used  Substance and Sexual Activity   Alcohol use: Yes    Alcohol/week: 4.0 standard drinks of alcohol    Types: 4 Shots of  liquor per week    Comment: occasional    Drug use: No  Sexual activity: Yes  Other Topics Concern   Not on file  Social History Narrative   Husband passed away in Sep 05, 2019.    Father passed away Jul 02, 2021.   Social Determinants of Health   Financial Resource Strain: Not on file  Food Insecurity: Not on file  Transportation Needs: Not on file  Physical Activity: Not on file  Stress: Not on file  Social Connections: Not on file  Intimate Partner Violence: Not on file   Family History  Problem Relation Age of Onset   Diabetes Father    Diabetes Brother    Diabetes Brother      Review of Systems  All other systems reviewed and are negative.      Objective:   Physical Exam Vitals reviewed.  Constitutional:      General: She is not in acute distress.    Appearance: She is well-developed. She is not diaphoretic.  HENT:     Right Ear: External ear normal.     Left Ear: External ear normal.     Nose: Nose normal.     Mouth/Throat:     Pharynx: No oropharyngeal exudate.  Eyes:     Conjunctiva/sclera: Conjunctivae normal.     Pupils: Pupils are equal, round, and reactive to light.  Neck:     Thyroid: No thyromegaly.     Vascular: No JVD.  Cardiovascular:     Rate and Rhythm: Normal rate and regular rhythm.     Heart sounds: Normal heart sounds. No murmur heard. Pulmonary:     Effort: Pulmonary effort is normal. No respiratory distress.     Breath sounds: Normal breath sounds. No wheezing or rales.  Chest:     Chest wall: No tenderness.  Abdominal:     General: Bowel sounds are normal. There is no distension.     Palpations: Abdomen is soft.     Tenderness: There is no abdominal tenderness. There is no guarding or rebound.  Musculoskeletal:     Cervical back: Neck supple.  Lymphadenopathy:     Cervical: No cervical adenopathy.         Assessment & Plan:  General medical exam Physical exam today is completely normal.  Cholesterol slightly elevated but the  majority is good cholesterol.  I recommend trying fish oil 2000 mg daily to help lower her LDL cholesterol.  She received her flu shot today.  I recommended a COVID booster.  We discussed the shingles vaccine.  Also recommended RSV vaccine given the fact she is caring for her young grandchild who is less than a month.  Mammogram, colonoscopy, and Pap smear are up-to-date.

## 2022-02-14 NOTE — Addendum Note (Signed)
Addended by: Randal Buba K on: 02/14/2022 04:33 PM   Modules accepted: Orders

## 2022-02-14 NOTE — Telephone Encounter (Addendum)
Received on (02/13/2022) via of fax Plan of Care from [place].  Requesting signature and return.  Given to provider to sign.  Plan of Care signed and faxed back to [facility].  Confirmation received and copy scanned into the chart.//AB/CMA

## 2022-02-20 ENCOUNTER — Telehealth: Payer: Self-pay | Admitting: Plastic Surgery

## 2022-02-20 NOTE — Telephone Encounter (Signed)
Returned patients call. Was advised her last PT was yesterday. Called [facility], spoke with Clyde Canterbury requested to fax patients PT notes excluding 9/6 as we already have scanned in her chart.

## 2022-02-20 NOTE — Telephone Encounter (Signed)
Pt called stating she was returning a call from [place].

## 2022-03-06 ENCOUNTER — Telehealth: Payer: Self-pay

## 2022-03-06 ENCOUNTER — Other Ambulatory Visit: Payer: Self-pay | Admitting: Family Medicine

## 2022-03-06 NOTE — Telephone Encounter (Signed)
Called [facility] again. Still waiting on PT notes of 9/11, 9/19, 9/21, and 9/26. Was advised they will be faxing this morning. Called patient, LMVM I have been waiting on the remainder of PT notes. Once received I can submit to her insurance company.

## 2022-03-06 NOTE — Telephone Encounter (Signed)
Faxed/submitted for pre-authorization to [employer]. Called patient, LMVM documents have been submitted to her insurance. Will call back once we receive a response.

## 2022-03-06 NOTE — Telephone Encounter (Signed)
Requested medication (s) are due for refill today - expired Rx  Requested medication (s) are on the active medication list -yes  Future visit scheduled -no  Last refill: 02/27/21 55ml 11RF  Notes to clinic: expired Rx  Requested Prescriptions  Pending Prescriptions Disp Refills   cyanocobalamin (VITAMIN B12) 1000 MCG/ML injection [Pharmacy Med Name: CYANOCOBALAMIN 1,000 MCG/ML VL] 3 mL 11    Sig: INJECT 1 ML INTO THE MUSCLE ONCE A MONTH     Endocrinology:  Vitamins - Vitamin B12 Failed - 03/06/2022  2:36 AM      Failed - B12 Level in normal range and within 360 days    No results found for: "VITAMINB12"       Failed - Valid encounter within last 12 months    Recent Outpatient Visits           3 years ago Easy bruising   [place] Dennard Schaumann, Cammie Mcgee, MD   4 years ago Pure hypercholesterolemia   [place] Dennard Schaumann, Cammie Mcgee, MD   5 years ago B12 deficiency   [place] Susy Frizzle, MD   7 years ago Popliteal artery aneurysm   [place] Susy Frizzle, MD   9 years ago Epidermoid cyst   [place], Mary B, PA-C              Passed - HCT in normal range and within 360 days    HCT  Date Value Ref Range Status  02/12/2022 40.8 35.0 - 45.0 % Final         Passed - HGB in normal range and within 360 days    Hemoglobin  Date Value Ref Range Status  02/12/2022 13.6 11.7 - 15.5 g/dL Final            Requested Prescriptions  Pending Prescriptions Disp Refills   cyanocobalamin (VITAMIN B12) 1000 MCG/ML injection [Pharmacy Med Name: CYANOCOBALAMIN 1,000 MCG/ML VL] 3 mL 11    Sig: INJECT [place] A MONTH     Endocrinology:  Vitamins - Vitamin B12 Failed - 03/06/2022  2:36 AM      Failed - B12 Level in normal range and within 360 days    No results found for: "VITAMINB12"       Failed - Valid encounter within last 12 months    Recent Outpatient  Visits           3 years ago Easy bruising   [place] Susy Frizzle, MD   4 years ago Pure hypercholesterolemia   [place] Dennard Schaumann, Cammie Mcgee, MD   5 years ago B12 deficiency   [place] Dennard Schaumann, Cammie Mcgee, MD   7 years ago Popliteal artery aneurysm   [place] Susy Frizzle, MD   9 years ago Epidermoid cyst   [place], Mary B, PA-C              Passed - HCT in normal range and within 360 days    HCT  Date Value Ref Range Status  02/12/2022 40.8 35.0 - 45.0 % Final         Passed - HGB in normal range and within 360 days    Hemoglobin  Date Value Ref Range Status  02/12/2022 13.6 11.7 - 15.5 g/dL Final

## 2022-03-07 ENCOUNTER — Ambulatory Visit (INDEPENDENT_AMBULATORY_CARE_PROVIDER_SITE_OTHER): Payer: BC Managed Care – PPO | Admitting: Plastic Surgery

## 2022-03-07 VITALS — BP 110/68 | HR 73 | Ht 59.0 in | Wt 101.0 lb

## 2022-03-07 DIAGNOSIS — M542 Cervicalgia: Secondary | ICD-10-CM | POA: Diagnosis not present

## 2022-03-07 DIAGNOSIS — M546 Pain in thoracic spine: Secondary | ICD-10-CM

## 2022-03-07 DIAGNOSIS — Z6820 Body mass index (BMI) 20.0-20.9, adult: Secondary | ICD-10-CM

## 2022-03-07 DIAGNOSIS — N62 Hypertrophy of breast: Secondary | ICD-10-CM | POA: Diagnosis not present

## 2022-03-07 NOTE — Progress Notes (Unsigned)
Referring Provider Susy Frizzle, MD [address] [place],  [place] [address]   CC:  Chief Complaint  Patient presents with   Advice Only      Karen Barnett is an 66 y.o. female.  HPI: Karen Barnett is a 66 year old female who has previously been seen for bilateral breast reduction.  She notes she still has upper back and neck pain and feels that her breasts are out of proportion to the rest of her body.  She is requesting surgical reduction.  No Known Allergies  Outpatient Encounter Medications as of 03/07/2022  Medication Sig   aspirin EC 81 MG tablet Take 1 tablet (81 mg total) by mouth daily.   cyanocobalamin (,VITAMIN B-12,) 1000 MCG/ML injection INJECT 1 ML INTO THE MUSCLE ONCE A MONTH   denosumab (PROLIA) 60 MG/ML SOSY injection Inject 60 mg into the skin every 6 (six) months.   No facility-administered encounter medications on file as of 03/07/2022.     Past Medical History:  Diagnosis Date   Anxiety    situational   Crohn's colitis ([place])    Crohn's disease ([place])    Osteoporosis    Popliteal aneurysm ([place])    Right    Past Surgical History:  Procedure Laterality Date   ABDOMINAL AORTAGRAM     ABDOMINAL AORTAGRAM N/A 08/01/2014   Procedure: ABDOMINAL Maxcine Ham;  Surgeon: Serafina Mitchell, MD;  Location: [facility] CATH LAB;  Service: Cardiovascular;  Laterality: N/A;   COLON SURGERY     COLONOSCOPY W/ BIOPSIES AND POLYPECTOMY     DILATION AND CURETTAGE OF UTERUS     FEMORAL-POPLITEAL BYPASS GRAFT Right 08/02/2014   Procedure: Resection Right popliteal artery aneurysm with Insertion of Reversed Saphenous Vein Graft Above Knee to Above Knee Popliteal;  Surgeon: Mal Misty, MD;  Location: [facility] OR;  Service: Vascular;  Laterality: Right;   VEIN HARVEST Right 08/02/2014   Procedure: Saphenous VEIN HARVEST Right Leg;  Surgeon: Mal Misty, MD;  Location: [facility] OR;  Service: Vascular;  Laterality: Right;   WISDOM TOOTH EXTRACTION      Family History  Problem  Relation Age of Onset   Diabetes Father    Diabetes Brother    Diabetes Brother     Social History   Social History Narrative   Husband passed away in 08-22-19.    Father passed away 2021/06/24.     Review of Systems General: Denies fevers, chills, weight loss CV: Denies chest pain, shortness of breath, palpitations Breast: Upper back and neck pain due to the large size of breast.  Physical Exam    03/07/2022    1:35 PM 02/14/2022    3:00 PM 12/23/2021    2:08 PM  Vitals with BMI  Height 4\' 11"  4\' 11"  4\' 11"   Weight 101 lbs 102 lbs 6 oz 102 lbs  BMI 20.39 123456 [id number]  Systolic [id number]  Diastolic 68 62 65  Pulse 73 73 70    General:  No acute distress,  Alert and oriented, Non-Toxic, Normal speech and affect Breast: The patient does have breasts that are out of proportion to her body size they are moderately heavy but she does have other stigmata of heavy breast including grooving at the shoulders from her bra straps. Mammogram: Mammogram done on Oct 02, 2021 was BI-RADS 1  Assessment/Plan Symptomatic macromastia: The patient was previously seen by Dr. Lyda Jester who estimated a volume of 225 g from each breast could be removed  I would agree with this assessment.  I briefly reviewed the procedure including the location of the incisions the unpredictable nature of scarring and the risks of bleeding infection and seroma formation.  Additionally we discussed the risks of nipple loss due to ischemia and changes in sensation in the breast including loss of feeling in the nipples.  She is interested in proceeding though she notes that she needs to have this done by the end of the year.  Karen Barnett 03/07/2022, 5:48 PM

## 2022-03-10 ENCOUNTER — Encounter: Payer: Self-pay | Admitting: Plastic Surgery

## 2022-03-18 ENCOUNTER — Telehealth: Payer: Self-pay

## 2022-03-18 NOTE — Telephone Encounter (Signed)
Refaxed for PA to [phone]

## 2022-03-21 ENCOUNTER — Telehealth: Payer: Self-pay

## 2022-03-21 NOTE — Telephone Encounter (Signed)
Returned patients call, LMVM case is still pending. Everything has been sent to [facility]. I have called and LM on Katie Dobis insurance VM who has been working on Tax inspector.

## 2022-03-24 ENCOUNTER — Telehealth: Payer: Self-pay

## 2022-03-24 NOTE — Telephone Encounter (Signed)
Called patient, LMVM insurance with [employer] was approved. However, there is no OR time before patient goes with Medicare in December. Requested once she receives her new insurance card, provide [place] a copy of the front and back of the card and we will resubmit again for approval.

## 2022-04-30 DIAGNOSIS — M810 Age-related osteoporosis without current pathological fracture: Secondary | ICD-10-CM | POA: Diagnosis not present

## 2022-05-15 DIAGNOSIS — M810 Age-related osteoporosis without current pathological fracture: Secondary | ICD-10-CM | POA: Diagnosis not present

## 2022-06-05 ENCOUNTER — Telehealth: Payer: Self-pay

## 2022-06-05 ENCOUNTER — Other Ambulatory Visit: Payer: Self-pay

## 2022-06-05 NOTE — Telephone Encounter (Signed)
Called LMVM to inquire if she decided to go with Medicare.  If so, need copy of front and the back of the card.

## 2022-06-06 NOTE — Telephone Encounter (Signed)
Patient has [employer] and will be coming by the office so that we can make copies of the front and the back of insurance card.

## 2022-06-12 ENCOUNTER — Other Ambulatory Visit: Payer: Self-pay | Admitting: Family Medicine

## 2022-06-12 NOTE — Telephone Encounter (Addendum)
Pt no longer has BCBS.  NEW INSURANCE IS [employer] MEDICARE. Effective 04/04/2022. CARD SCANNED. -Patient did not have her Medicare A&B card, she says she never got one in the mail.

## 2022-06-13 ENCOUNTER — Other Ambulatory Visit: Payer: Self-pay

## 2022-06-13 DIAGNOSIS — E538 Deficiency of other specified B group vitamins: Secondary | ICD-10-CM

## 2022-06-13 DIAGNOSIS — K5010 Crohn's disease of large intestine without complications: Secondary | ICD-10-CM

## 2022-06-13 MED ORDER — CYANOCOBALAMIN 1000 MCG/ML IJ SOLN: INTRAMUSCULAR | 11 refills | Status: DC

## 2022-06-16 ENCOUNTER — Telehealth: Payer: Self-pay | Admitting: Plastic Surgery

## 2022-06-16 NOTE — Telephone Encounter (Signed)
PreAuth started and clinicals faxed

## 2022-06-17 ENCOUNTER — Telehealth: Payer: Self-pay | Admitting: *Deleted

## 2022-06-17 NOTE — Telephone Encounter (Signed)
[employer] approval received for CPT [id number] - [id number] valid 06/30/22 - 09/28/22

## 2022-06-17 NOTE — Telephone Encounter (Signed)
LVM to schedule surgery 

## 2022-06-19 ENCOUNTER — Other Ambulatory Visit: Payer: Medicare HMO

## 2022-06-26 ENCOUNTER — Other Ambulatory Visit: Payer: Medicare HMO

## 2022-06-26 DIAGNOSIS — E538 Deficiency of other specified B group vitamins: Secondary | ICD-10-CM

## 2022-06-26 DIAGNOSIS — K5010 Crohn's disease of large intestine without complications: Secondary | ICD-10-CM

## 2022-06-27 LAB — VITAMIN B12: Vitamin B-12: 249 pg/mL (ref 200–1100)

## 2022-07-07 ENCOUNTER — Other Ambulatory Visit: Payer: Self-pay | Admitting: Family Medicine

## 2022-07-21 ENCOUNTER — Encounter: Payer: Self-pay | Admitting: Family Medicine

## 2022-07-21 ENCOUNTER — Ambulatory Visit (INDEPENDENT_AMBULATORY_CARE_PROVIDER_SITE_OTHER): Payer: Medicare HMO | Admitting: Family Medicine

## 2022-07-21 VITALS — BP 112/62 | HR 56 | Temp 98.4°F | Ht 59.0 in | Wt 101.0 lb

## 2022-07-21 DIAGNOSIS — W5503XA Scratched by cat, initial encounter: Secondary | ICD-10-CM | POA: Diagnosis not present

## 2022-07-21 DIAGNOSIS — S60512A Abrasion of left hand, initial encounter: Secondary | ICD-10-CM

## 2022-07-21 DIAGNOSIS — Z23 Encounter for immunization: Secondary | ICD-10-CM

## 2022-07-21 MED ORDER — AMOXICILLIN-POT CLAVULANATE 875-125 MG PO TABS: 1.0000 | ORAL_TABLET | Freq: Two times a day (BID) | ORAL | 0 refills | Status: DC
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
## 2022-07-23 ENCOUNTER — Telehealth: Payer: Self-pay | Admitting: *Deleted

## 2022-07-23 NOTE — Telephone Encounter (Signed)
LVM to schedule sx 

## 2022-08-08 ENCOUNTER — Telehealth: Payer: Self-pay | Admitting: *Deleted

## 2022-08-08 NOTE — Telephone Encounter (Signed)
Called patient to schedule surgery. She asked that I call back Tuesday morning when she doesn't have her 34m old grand daughter

## 2022-10-03 ENCOUNTER — Encounter: Payer: Medicare HMO | Admitting: Surgical

## 2022-10-08 ENCOUNTER — Ambulatory Visit (INDEPENDENT_AMBULATORY_CARE_PROVIDER_SITE_OTHER): Payer: Medicare HMO | Admitting: Student

## 2022-10-08 ENCOUNTER — Encounter: Payer: Self-pay | Admitting: Student

## 2022-10-08 VITALS — BP 134/88 | HR 80 | Ht 59.0 in | Wt 98.4 lb

## 2022-10-08 DIAGNOSIS — N62 Hypertrophy of breast: Secondary | ICD-10-CM

## 2022-10-08 MED ORDER — ONDANSETRON HCL 4 MG PO TABS: 4.0000 mg | ORAL_TABLET | Freq: Three times a day (TID) | ORAL | 0 refills | Status: DC | PRN

## 2022-10-08 MED ORDER — TRAMADOL HCL 50 MG PO TABS: 50.0000 mg | ORAL_TABLET | Freq: Four times a day (QID) | ORAL | 0 refills | Status: DC | PRN

## 2022-10-08 NOTE — H&P (View-Only) (Signed)
   Patient ID: Karen Barnett, female    DOB: 01/23/1956, 67 y.o.   MRN: [id number]  Chief Complaint  Patient presents with   Pre-op Exam      ICD-10-CM   1. Breast hypertrophy  N62        History of Present Illness: Karen Barnett is a 67 y.o.  female  with a history of macromastia.  She presents for preoperative evaluation for upcoming procedure, Bilateral Breast Reduction, scheduled for 10/28/2022 with Dr.  Taylor  The patient has not had problems with anesthesia.  Patient reports she had a lower leg aneurysm repair in 2016.  She states that she has not had any issues with this since then and sees vascular surgery.  She denies any cardiac issues.  Patient reports she takes aspirin, but not consistently.  Patient states she quit smoking 20 to 30 years ago.  She does states she smokes marijuana.  Patient denies taking any birth control or hormone replacement.  She reports history of 1 miscarriage.  She denies any personal family history of blood clots or clotting diseases.  She denies any recent traumas, surgeries, infections.  She denies any history of stroke or heart attack.  Patient does report history of Crohn's disease.  She denies any history of COPD or asthma.  She denies any history of cancer.  She denies any varicosities to lower extremities.  She denies any recent fevers, chills or changes in her health.  States she is currently at 34 D cup.  She states she would like to be a B/C cup.  I discussed with the patient that the amount of tissue that can be removed at the time of surgery is limited and that cup size cannot be guaranteed.  Patient expressed understanding.  Summary of Previous Visit: Patient was seen by Dr. Taylor on 03/07/2022.  At this visit, patient reported she had previously been seen for bilateral breast reduction.  Patient reported she still had upper back and neck pain and felt that her breasts were out of proportion to the size of her body.  Patient was requesting  surgical reduction.  On exam, patient was noted to have breast that were out of proportion to her body size.  Patient had previously been seen by Dr. Luppens who estimated a volume of 225 g from each breast could be removed.  Dr. Taylor was in agreement with this assessment.  Plan was to proceed with surgery.  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  She states her next injection is due in July.   Past Medical History: Allergies: No Known Allergies  Current Medications:  Current Outpatient Medications:    aspirin EC 81 MG tablet, Take 1 tablet (81 mg total) by mouth daily., Disp: , Rfl:    cyanocobalamin (VITAMIN B12) 1000 MCG/ML injection, Inject 1 ml into the muscle once a month., Disp: 3 mL, Rfl: 11   denosumab (PROLIA) 60 MG/ML SOSY injection, Inject 60 mg into the skin every 6 (six) months., Disp: , Rfl:    amoxicillin-clavulanate (AUGMENTIN) 875-125 MG tablet, Take 1 tablet by mouth 2 (two) times daily., Disp: 20 tablet, Rfl: 0  Past Medical Problems: Past Medical History:  Diagnosis Date     Anxiety    situational   Crohn's colitis ([facility])    Crohn's disease ([facility])    Osteoporosis    Popliteal aneurysm ([facility])    Right    Past Surgical History: Past Surgical History:  Procedure Laterality Date   ABDOMINAL AORTAGRAM     ABDOMINAL AORTAGRAM N/A 08/01/2014   Procedure: ABDOMINAL AORTAGRAM;  Surgeon: Vance W Brabham, MD;  Location: [facility] CATH LAB;  Service: Cardiovascular;  Laterality: N/A;   COLON SURGERY     COLONOSCOPY W/ BIOPSIES AND POLYPECTOMY     DILATION AND CURETTAGE OF UTERUS     FEMORAL-POPLITEAL BYPASS GRAFT Right 08/02/2014   Procedure: Resection Right popliteal artery aneurysm  with Insertion of Reversed Saphenous Vein Graft Above Knee to Above Knee Popliteal;  Surgeon: James D Lawson, MD;  Location: [facility] OR;  Service: Vascular;  Laterality: Right;   VEIN HARVEST Right 08/02/2014   Procedure: Saphenous VEIN HARVEST Right Leg;  Surgeon: James D Lawson, MD;  Location: [facility] OR;  Service: Vascular;  Laterality: Right;   WISDOM TOOTH EXTRACTION      Social History: Social History   Socioeconomic History   Marital status: Widowed    Spouse name: Not on file   Number of children: Not on file   Years of education: Not on file   Highest education level: Not on file  Occupational History   Not on file  Tobacco Use   Smoking status: Former    Types: Cigarettes    Quit date: 02/14/2009    Years since quitting: 13.6   Smokeless tobacco: Never  Vaping Use   Vaping Use: Never used  Substance and Sexual Activity   Alcohol use: Yes    Alcohol/week: 4.0 standard drinks of alcohol    Types: 4 Shots of liquor per week    Comment: occasional    Drug use: No   Sexual activity: Yes  Other Topics Concern   Not on file  Social History Narrative   Husband passed away in 2021.    Father passed away 06/14/2021.   Social Determinants of Health   Financial Resource Strain: Not on file  Food Insecurity: Not on file  Transportation Needs: Not on file  Physical Activity: Not on file  Stress: Not on file  Social Connections: Not on file  Intimate Partner Violence: Not on file    Family History: Family History  Problem Relation Age of Onset   Diabetes Father    Diabetes Brother    Diabetes Brother     Review of Systems: Denies any recent fevers, chills or changes in her health  Physical Exam: Vital Signs BP 134/88 (BP Location: Left Arm, Patient Position: Sitting, Cuff Size: Normal)   Pulse 80   Ht 4' 11" (1.499 m)   Wt 98 lb 6.4 oz (44.6 kg)   SpO2 96%   BMI 19.87 kg/m   Physical Exam  Constitutional:      General: Not in acute distress.    Appearance:  Normal appearance. Not ill-appearing.  HENT:     Head: Normocephalic and atraumatic.  Neck:     Musculoskeletal: Normal range of motion.  Cardiovascular:     Rate and Rhythm: Normal rate Pulmonary:     Effort: Pulmonary effort is normal. No respiratory distress.  Musculoskeletal: Normal range of motion.  Skin:    General: Skin is warm and dry.     Findings: No erythema or rash.  Neurological:     Mental Status: Alert and oriented to person,   place, and time. Mental status is at baseline.  Psychiatric:        Mood and Affect: Mood normal.        Behavior: Behavior normal.    Assessment/Plan: The patient is scheduled for bilateral breast reduction with Dr. Taylor.  Risks, benefits, and alternatives of procedure discussed, questions answered and consent obtained.    Smoking Status: Smokes marijuana; Counseling Given?  I recommended that she not smoke marijuana at least 1 week prior to surgery.  Patient expressed understanding. Last Mammogram: 10/02/2021; Results: BI-RADS Category 1 negative.  I recommended that the patient schedule her mammogram before surgery.  She states that she will call them today.  Caprini Score: 5; Risk Factors include: Age, history of inflammatory bowel disease, and length of planned surgery. Recommendation for mechanical pharmacological prophylaxis. Encourage early ambulation.   Pictures obtained: @consult  Post-op Rx sent to pharmacy:  Tramadol, zofran  I told the patient that she should hold her aspirin at least 7 days before surgery.  I recommended she also hold any vitamins and supplements at least 7 days before surgery.  Patient expressed understanding.  I also discussed with the patient that her Prolia may put her at increased risk of delayed wound healing.  Patient expressed understanding.  Patient was provided with the breast reduction and General Surgical Risk consent document and Pain Medication Agreement prior to their appointment.  They had adequate  time to read through the risk consent documents and Pain Medication Agreement. We also discussed them in person together during this preop appointment. All of their questions were answered to their satisfaction.  Recommended calling if they have any further questions.  Risk consent form and Pain Medication Agreement to be scanned into patient's chart.  The risk that can be encountered with breast reduction were discussed and include the following but not limited to these:  Breast asymmetry, fluid accumulation, firmness of the breast, inability to breast feed, loss of nipple or areola, skin loss, decrease or no nipple sensation, fat necrosis of the breast tissue, bleeding, infection, healing delay.  There are risks of anesthesia, changes to skin sensation and injury to nerves or blood vessels.  The muscle can be temporarily or permanently injured.  You may have an allergic reaction to tape, suture, glue, blood products which can result in skin discoloration, swelling, pain, skin lesions, poor healing.  Any of these can lead to the need for revisonal surgery or stage procedures.  A reduction has potential to interfere with diagnostic procedures.  Nipple or breast piercing can increase risks of infection.  This procedure is best done when the breast is fully developed.  Changes in the breast will continue to occur over time.  Pregnancy can alter the outcomes of previous breast reduction surgery, weight gain and weigh loss can also effect the long term appearance.     Electronically signed by: Darice Vicario E Marlea Gambill, PA-C 10/08/2022 4:04 PM  

## 2022-10-08 NOTE — Progress Notes (Signed)
Patient ID: Karen Barnett, female    DOB: 1956-02-13, 67 y.o.   MRN: [id number]  Chief Complaint  Patient presents with   Pre-op Exam      ICD-10-CM   1. Breast hypertrophy  N62        History of Present Illness: Karen Barnett is a 67 y.o.  female  with a history of macromastia.  She presents for preoperative evaluation for upcoming procedure, Bilateral Breast Reduction, scheduled for 10/28/2022 with Dr.  Ladona Ridgel  The patient has not had problems with anesthesia.  Patient reports she had a lower leg aneurysm repair in 2016.  She states that she has not had any issues with this since then and sees vascular surgery.  She denies any cardiac issues.  Patient reports she takes aspirin, but not consistently.  Patient states she quit smoking 20 to 30 years ago.  She does states she smokes marijuana.  Patient denies taking any birth control or hormone replacement.  She reports history of 1 miscarriage.  She denies any personal family history of blood clots or clotting diseases.  She denies any recent traumas, surgeries, infections.  She denies any history of stroke or heart attack.  Patient does report history of Crohn's disease.  She denies any history of COPD or asthma.  She denies any history of cancer.  She denies any varicosities to lower extremities.  She denies any recent fevers, chills or changes in her health.  States she is currently at 39 D cup.  She states she would like to be a B/C cup.  I discussed with the patient that the amount of tissue that can be removed at the time of surgery is limited and that cup size cannot be guaranteed.  Patient expressed understanding.  Summary of Previous Visit: Patient was seen by Dr. Ladona Ridgel on 03/07/2022.  At this visit, patient reported she had previously been seen for bilateral breast reduction.  Patient reported she still had upper back and neck pain and felt that her breasts were out of proportion to the size of her body.  Patient was requesting  surgical reduction.  On exam, patient was noted to have breast that were out of proportion to her body size.  Patient had previously been seen by Dr. Domenica Reamer who estimated a volume of 225 g from each breast could be removed.  Dr. Ladona Ridgel was in agreement with this assessment.  Plan was to proceed with surgery.  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  She states her next injection is due in July.   Past Medical History: Allergies: No Known Allergies  Current Medications:  Current Outpatient Medications:    aspirin EC 81 MG tablet, Take 1 tablet (81 mg total) by mouth daily., Disp: , Rfl:    cyanocobalamin (VITAMIN B12) 1000 MCG/ML injection, Inject 1 ml into the muscle once a month., Disp: 3 mL, Rfl: 11   denosumab (PROLIA) 60 MG/ML SOSY injection, Inject 60 mg into the skin every 6 (six) months., Disp: , Rfl:    amoxicillin-clavulanate (AUGMENTIN) 875-125 MG tablet, Take 1 tablet by mouth 2 (two) times daily., Disp: 20 tablet, Rfl: 0  Past Medical Problems: Past Medical History:  Diagnosis Date  Anxiety    situational   Crohn's colitis (HCC)    Crohn's disease (HCC)    Osteoporosis    Popliteal aneurysm (HCC)    Right    Past Surgical History: Past Surgical History:  Procedure Laterality Date   ABDOMINAL AORTAGRAM     ABDOMINAL AORTAGRAM N/A 08/01/2014   Procedure: ABDOMINAL Ronny Flurry;  Surgeon: Nada Libman, MD;  Location: [facility] CATH LAB;  Service: Cardiovascular;  Laterality: N/A;   COLON SURGERY     COLONOSCOPY W/ BIOPSIES AND POLYPECTOMY     DILATION AND CURETTAGE OF UTERUS     FEMORAL-POPLITEAL BYPASS GRAFT Right 08/02/2014   Procedure: Resection Right popliteal artery aneurysm  with Insertion of Reversed Saphenous Vein Graft Above Knee to Above Knee Popliteal;  Surgeon: Pryor Ochoa, MD;  Location: [facility] OR;  Service: Vascular;  Laterality: Right;   VEIN HARVEST Right 08/02/2014   Procedure: Saphenous VEIN HARVEST Right Leg;  Surgeon: Pryor Ochoa, MD;  Location: [facility] OR;  Service: Vascular;  Laterality: Right;   WISDOM TOOTH EXTRACTION      Social History: Social History   Socioeconomic History   Marital status: Widowed    Spouse name: Not on file   Number of children: Not on file   Years of education: Not on file   Highest education level: Not on file  Occupational History   Not on file  Tobacco Use   Smoking status: Former    Types: Cigarettes    Quit date: 02/14/2009    Years since quitting: 13.6   Smokeless tobacco: Never  Vaping Use   Vaping Use: Never used  Substance and Sexual Activity   Alcohol use: Yes    Alcohol/week: 4.0 standard drinks of alcohol    Types: 4 Shots of liquor per week    Comment: occasional    Drug use: No   Sexual activity: Yes  Other Topics Concern   Not on file  Social History Narrative   Husband passed away in 2019-11-11.    Father passed away July 14, 2021.   Social Determinants of Health   Financial Resource Strain: Not on file  Food Insecurity: Not on file  Transportation Needs: Not on file  Physical Activity: Not on file  Stress: Not on file  Social Connections: Not on file  Intimate Partner Violence: Not on file    Family History: Family History  Problem Relation Age of Onset   Diabetes Father    Diabetes Brother    Diabetes Brother     Review of Systems: Denies any recent fevers, chills or changes in her health  Physical Exam: Vital Signs BP 134/88 (BP Location: Left Arm, Patient Position: Sitting, Cuff Size: Normal)   Pulse 80   Ht 4\' 11"  (1.499 m)   Wt 98 lb 6.4 oz (44.6 kg)   SpO2 96%   BMI 19.87 kg/m   Physical Exam  Constitutional:      General: Not in acute distress.    Appearance:  Normal appearance. Not ill-appearing.  HENT:     Head: Normocephalic and atraumatic.  Neck:     Musculoskeletal: Normal range of motion.  Cardiovascular:     Rate and Rhythm: Normal rate Pulmonary:     Effort: Pulmonary effort is normal. No respiratory distress.  Musculoskeletal: Normal range of motion.  Skin:    General: Skin is warm and dry.     Findings: No erythema or rash.  Neurological:     Mental Status: Alert and oriented to person,  place, and time. Mental status is at baseline.  Psychiatric:        Mood and Affect: Mood normal.        Behavior: Behavior normal.    Assessment/Plan: The patient is scheduled for bilateral breast reduction with Dr. Ladona Ridgel.  Risks, benefits, and alternatives of procedure discussed, questions answered and consent obtained.    Smoking Status: Smokes marijuana; Counseling Given?  I recommended that she not smoke marijuana at least 1 week prior to surgery.  Patient expressed understanding. Last Mammogram: 10/02/2021; Results: BI-RADS Category 1 negative.  I recommended that the patient schedule her mammogram before surgery.  She states that she will call them today.  Caprini Score: 5; Risk Factors include: Age, history of inflammatory bowel disease, and length of planned surgery. Recommendation for mechanical pharmacological prophylaxis. Encourage early ambulation.   Pictures obtained: @consult   Post-op Rx sent to pharmacy:  Tramadol, zofran  I told the patient that she should hold her aspirin at least 7 days before surgery.  I recommended she also hold any vitamins and supplements at least 7 days before surgery.  Patient expressed understanding.  I also discussed with the patient that her Prolia may put her at increased risk of delayed wound healing.  Patient expressed understanding.  Patient was provided with the breast reduction and General Surgical Risk consent document and Pain Medication Agreement prior to their appointment.  They had adequate  time to read through the risk consent documents and Pain Medication Agreement. We also discussed them in person together during this preop appointment. All of their questions were answered to their satisfaction.  Recommended calling if they have any further questions.  Risk consent form and Pain Medication Agreement to be scanned into patient's chart.  The risk that can be encountered with breast reduction were discussed and include the following but not limited to these:  Breast asymmetry, fluid accumulation, firmness of the breast, inability to breast feed, loss of nipple or areola, skin loss, decrease or no nipple sensation, fat necrosis of the breast tissue, bleeding, infection, healing delay.  There are risks of anesthesia, changes to skin sensation and injury to nerves or blood vessels.  The muscle can be temporarily or permanently injured.  You may have an allergic reaction to tape, suture, glue, blood products which can result in skin discoloration, swelling, pain, skin lesions, poor healing.  Any of these can lead to the need for revisonal surgery or stage procedures.  A reduction has potential to interfere with diagnostic procedures.  Nipple or breast piercing can increase risks of infection.  This procedure is best done when the breast is fully developed.  Changes in the breast will continue to occur over time.  Pregnancy can alter the outcomes of previous breast reduction surgery, weight gain and weigh loss can also effect the long term appearance.     Electronically signed by: Laurena Spies, PA-C 10/08/2022 4:04 PM

## 2022-10-10 ENCOUNTER — Telehealth: Payer: Self-pay | Admitting: Family Medicine

## 2022-10-10 ENCOUNTER — Telehealth: Payer: Self-pay | Admitting: Student

## 2022-10-10 NOTE — Telephone Encounter (Signed)
Patient called and said she missed a call from [facility] yesterday but wasn't sure what it was about. I didn't see a note on my side so I let her know I would send a message back so someone can call her back

## 2022-10-10 NOTE — Telephone Encounter (Signed)
err

## 2022-10-10 NOTE — Telephone Encounter (Signed)
Called patient in regards to obtaining a mammogram prior to surgery. She states she called and was able to get it scheduled for next Tuesday.

## 2022-10-14 DIAGNOSIS — Z1231 Encounter for screening mammogram for malignant neoplasm of breast: Secondary | ICD-10-CM | POA: Diagnosis not present

## 2022-10-21 ENCOUNTER — Other Ambulatory Visit: Payer: Self-pay

## 2022-10-21 ENCOUNTER — Encounter (HOSPITAL_BASED_OUTPATIENT_CLINIC_OR_DEPARTMENT_OTHER): Payer: Self-pay | Admitting: Plastic Surgery

## 2022-10-27 NOTE — Anesthesia Preprocedure Evaluation (Signed)
Anesthesia Evaluation  Patient identified by MRN, date of birth, ID band Patient awake    Reviewed: Allergy & Precautions, NPO status , Patient's Chart, lab work & pertinent test results  History of Anesthesia Complications Negative for: history of anesthetic complications  Airway Mallampati: II  TM Distance: >3 FB Neck ROM: Full    Dental no notable dental hx.    Pulmonary former smoker   Pulmonary exam normal        Cardiovascular negative cardio ROS Normal cardiovascular exam     Neuro/Psych   Anxiety        GI/Hepatic Crohn's dx   Endo/Other  negative endocrine ROS    Renal/GU negative Renal ROS     Musculoskeletal negative musculoskeletal ROS (+)    Abdominal   Peds  Hematology negative hematology ROS (+)   Anesthesia Other Findings macromastia  Reproductive/Obstetrics                              Anesthesia Physical Anesthesia Plan  ASA: 2  Anesthesia Plan: General   Post-op Pain Management: Tylenol PO (pre-op)* and Ketamine IV*   Induction: Intravenous  PONV Risk Score and Plan: 3 and Dexamethasone, Ondansetron, Treatment may vary due to age or medical condition and Midazolam  Airway Management Planned: Oral ETT  Additional Equipment: None  Intra-op Plan:   Post-operative Plan: Extubation in OR  Informed Consent: I have reviewed the patients History and Physical, chart, labs and discussed the procedure including the risks, benefits and alternatives for the proposed anesthesia with the patient or authorized representative who has indicated his/her understanding and acceptance.     Dental advisory given  Plan Discussed with: CRNA  Anesthesia Plan Comments:         Anesthesia Quick Evaluation

## 2022-10-28 ENCOUNTER — Ambulatory Visit (HOSPITAL_BASED_OUTPATIENT_CLINIC_OR_DEPARTMENT_OTHER)
Admission: RE | Admit: 2022-10-28 | Discharge: 2022-10-28 | Disposition: A | Discharge status: Home / Self Care | Payer: Medicare HMO | Attending: Plastic Surgery | Admitting: Plastic Surgery

## 2022-10-28 ENCOUNTER — Encounter (HOSPITAL_BASED_OUTPATIENT_CLINIC_OR_DEPARTMENT_OTHER): Payer: Self-pay | Admitting: Plastic Surgery

## 2022-10-28 ENCOUNTER — Other Ambulatory Visit: Payer: Self-pay

## 2022-10-28 ENCOUNTER — Encounter (HOSPITAL_BASED_OUTPATIENT_CLINIC_OR_DEPARTMENT_OTHER)
Admission: RE | Disposition: A | Discharge status: Home / Self Care | Payer: Self-pay | Source: Home / Self Care | Attending: Plastic Surgery

## 2022-10-28 ENCOUNTER — Ambulatory Visit (HOSPITAL_BASED_OUTPATIENT_CLINIC_OR_DEPARTMENT_OTHER): Payer: Medicare HMO | Admitting: Anesthesiology

## 2022-10-28 DIAGNOSIS — M542 Cervicalgia: Secondary | ICD-10-CM | POA: Diagnosis not present

## 2022-10-28 DIAGNOSIS — N6022 Fibroadenosis of left breast: Secondary | ICD-10-CM | POA: Insufficient documentation

## 2022-10-28 DIAGNOSIS — Z87891 Personal history of nicotine dependence: Secondary | ICD-10-CM

## 2022-10-28 DIAGNOSIS — F419 Anxiety disorder, unspecified: Secondary | ICD-10-CM

## 2022-10-28 DIAGNOSIS — N62 Hypertrophy of breast: Secondary | ICD-10-CM

## 2022-10-28 DIAGNOSIS — M546 Pain in thoracic spine: Secondary | ICD-10-CM

## 2022-10-28 DIAGNOSIS — Z01818 Encounter for other preprocedural examination: Secondary | ICD-10-CM

## 2022-10-28 DIAGNOSIS — N6021 Fibroadenosis of right breast: Secondary | ICD-10-CM | POA: Diagnosis not present

## 2022-10-28 DIAGNOSIS — N6012 Diffuse cystic mastopathy of left breast: Secondary | ICD-10-CM | POA: Diagnosis not present

## 2022-10-28 DIAGNOSIS — K5090 Crohn's disease, unspecified, without complications: Secondary | ICD-10-CM | POA: Insufficient documentation

## 2022-10-28 DIAGNOSIS — M810 Age-related osteoporosis without current pathological fracture: Secondary | ICD-10-CM | POA: Insufficient documentation

## 2022-10-28 DIAGNOSIS — M549 Dorsalgia, unspecified: Secondary | ICD-10-CM | POA: Diagnosis not present

## 2022-10-28 DIAGNOSIS — N6011 Diffuse cystic mastopathy of right breast: Secondary | ICD-10-CM | POA: Diagnosis not present

## 2022-10-28 HISTORY — PX: BREAST REDUCTION SURGERY: SHX8

## 2022-10-28 SURGERY — MAMMOPLASTY, REDUCTION
Anesthesia: General | Site: Breast | Laterality: Bilateral

## 2022-10-28 MED ORDER — 0.9 % SODIUM CHLORIDE (POUR BTL) OPTIME
TOPICAL | Status: DC | PRN
  Administered 2022-10-28: 1000 mL

## 2022-10-28 MED ORDER — CHLORHEXIDINE GLUCONATE CLOTH 2 % EX PADS: 6.0000 | MEDICATED_PAD | Freq: Once | CUTANEOUS | Status: DC

## 2022-10-28 MED ORDER — KETAMINE HCL 10 MG/ML IJ SOLN
INTRAMUSCULAR | Status: DC | PRN
  Administered 2022-10-28: 20 mg via INTRAVENOUS

## 2022-10-28 MED ORDER — ROCURONIUM BROMIDE 100 MG/10ML IV SOLN
INTRAVENOUS | Status: DC | PRN
  Administered 2022-10-28: 10 mg via INTRAVENOUS
  Administered 2022-10-28: 40 mg via INTRAVENOUS

## 2022-10-28 MED ORDER — FENTANYL CITRATE (PF) 100 MCG/2ML IJ SOLN
INTRAMUSCULAR | Status: AC
  Filled 2022-10-28: qty 2

## 2022-10-28 MED ORDER — LIDOCAINE 2% (20 MG/ML) 5 ML SYRINGE
INTRAMUSCULAR | Status: AC
  Filled 2022-10-28: qty 5

## 2022-10-28 MED ORDER — KETAMINE HCL 50 MG/5ML IJ SOSY
PREFILLED_SYRINGE | INTRAMUSCULAR | Status: AC
  Filled 2022-10-28: qty 5

## 2022-10-28 MED ORDER — OXYCODONE HCL 5 MG PO TABS
ORAL_TABLET | ORAL | Status: AC
  Filled 2022-10-28: qty 1

## 2022-10-28 MED ORDER — SODIUM CHLORIDE (PF) 0.9 % IJ SOLN
INTRAMUSCULAR | Status: AC
  Filled 2022-10-28: qty 20

## 2022-10-28 MED ORDER — ONDANSETRON HCL 4 MG/2ML IJ SOLN
INTRAMUSCULAR | Status: AC
  Filled 2022-10-28: qty 2

## 2022-10-28 MED ORDER — LACTATED RINGERS IV SOLN: INTRAVENOUS | Status: DC

## 2022-10-28 MED ORDER — SUGAMMADEX SODIUM 200 MG/2ML IV SOLN
INTRAVENOUS | Status: DC | PRN
  Administered 2022-10-28: 200 mg via INTRAVENOUS

## 2022-10-28 MED ORDER — FENTANYL CITRATE (PF) 100 MCG/2ML IJ SOLN
INTRAMUSCULAR | Status: DC | PRN
  Administered 2022-10-28 (×4): 50 ug via INTRAVENOUS

## 2022-10-28 MED ORDER — OXYCODONE HCL 5 MG PO TABS
5.0000 mg | ORAL_TABLET | Freq: Once | ORAL | Status: AC
  Administered 2022-10-28: 5 mg via ORAL

## 2022-10-28 MED ORDER — PROPOFOL 500 MG/50ML IV EMUL
INTRAVENOUS | Status: AC
  Filled 2022-10-28: qty 50

## 2022-10-28 MED ORDER — LIDOCAINE HCL (CARDIAC) PF 100 MG/5ML IV SOSY
PREFILLED_SYRINGE | INTRAVENOUS | Status: DC | PRN
  Administered 2022-10-28: 50 mg via INTRAVENOUS

## 2022-10-28 MED ORDER — SODIUM CHLORIDE 0.9 % IV SOLN
INTRAVENOUS | Status: DC | PRN
  Administered 2022-10-28: 40 mL

## 2022-10-28 MED ORDER — ROCURONIUM BROMIDE 10 MG/ML (PF) SYRINGE
PREFILLED_SYRINGE | INTRAVENOUS | Status: AC
  Filled 2022-10-28: qty 10

## 2022-10-28 MED ORDER — ACETAMINOPHEN 500 MG PO TABS
1000.0000 mg | ORAL_TABLET | Freq: Once | ORAL | Status: AC
  Administered 2022-10-28: 1000 mg via ORAL

## 2022-10-28 MED ORDER — ACETAMINOPHEN 500 MG PO TABS
ORAL_TABLET | ORAL | Status: AC
  Filled 2022-10-28: qty 2

## 2022-10-28 MED ORDER — ONDANSETRON HCL 4 MG/2ML IJ SOLN
INTRAMUSCULAR | Status: DC | PRN
  Administered 2022-10-28: 4 mg via INTRAVENOUS

## 2022-10-28 MED ORDER — BUPIVACAINE LIPOSOME 1.3 % IJ SUSP
INTRAMUSCULAR | Status: AC
  Filled 2022-10-28: qty 20

## 2022-10-28 MED ORDER — PROPOFOL 10 MG/ML IV BOLUS
INTRAVENOUS | Status: DC | PRN
  Administered 2022-10-28: 90 mg via INTRAVENOUS

## 2022-10-28 MED ORDER — MIDAZOLAM HCL 5 MG/5ML IJ SOLN
INTRAMUSCULAR | Status: DC | PRN
  Administered 2022-10-28: 2 mg via INTRAVENOUS

## 2022-10-28 MED ORDER — CEFAZOLIN SODIUM-DEXTROSE 2-4 GM/100ML-% IV SOLN
INTRAVENOUS | Status: AC
  Filled 2022-10-28: qty 100

## 2022-10-28 MED ORDER — AMISULPRIDE (ANTIEMETIC) 5 MG/2ML IV SOLN: 10.0000 mg | Freq: Once | INTRAVENOUS | Status: DC | PRN

## 2022-10-28 MED ORDER — HYDROMORPHONE HCL 1 MG/ML IJ SOLN: 0.2500 mg | INTRAMUSCULAR | Status: DC | PRN

## 2022-10-28 MED ORDER — CEFAZOLIN SODIUM-DEXTROSE 2-4 GM/100ML-% IV SOLN: 2.0000 g | INTRAVENOUS | Status: DC

## 2022-10-28 MED ORDER — DEXAMETHASONE SODIUM PHOSPHATE 10 MG/ML IJ SOLN
INTRAMUSCULAR | Status: AC
  Filled 2022-10-28: qty 1

## 2022-10-28 MED ORDER — DEXAMETHASONE SODIUM PHOSPHATE 4 MG/ML IJ SOLN
INTRAMUSCULAR | Status: DC | PRN
  Administered 2022-10-28: 4 mg via INTRAVENOUS

## 2022-10-28 MED ORDER — MIDAZOLAM HCL 2 MG/2ML IJ SOLN
INTRAMUSCULAR | Status: AC
  Filled 2022-10-28: qty 2

## 2022-10-28 SURGICAL SUPPLY — 56 items
ADH SKN CLS APL DERMABOND .7 (GAUZE/BANDAGES/DRESSINGS) ×2
BINDER BREAST 3XL (GAUZE/BANDAGES/DRESSINGS) IMPLANT
BINDER BREAST LRG (GAUZE/BANDAGES/DRESSINGS) IMPLANT
BINDER BREAST MEDIUM (GAUZE/BANDAGES/DRESSINGS) IMPLANT
BINDER BREAST XLRG (GAUZE/BANDAGES/DRESSINGS) IMPLANT
BINDER BREAST XXLRG (GAUZE/BANDAGES/DRESSINGS) IMPLANT
BIOPATCH RED 1 DISK 7.0 (GAUZE/BANDAGES/DRESSINGS) IMPLANT
BLADE SURG 10 STRL SS (BLADE) ×4 IMPLANT
BLADE SURG 15 STRL LF DISP TIS (BLADE) ×1 IMPLANT
BLADE SURG 15 STRL SS (BLADE) ×1
CANISTER SUCT 1200ML W/VALVE (MISCELLANEOUS) ×1 IMPLANT
DERMABOND ADVANCED .7 DNX12 (GAUZE/BANDAGES/DRESSINGS) ×2 IMPLANT
DRAIN CHANNEL 19F RND (DRAIN) ×2 IMPLANT
DRAPE UTILITY XL STRL (DRAPES) ×1 IMPLANT
DRSG TEGADERM 4X4.75 (GAUZE/BANDAGES/DRESSINGS) IMPLANT
ELECT BLADE 4.0 EZ CLEAN MEGAD (MISCELLANEOUS) ×1
ELECT REM PT RETURN 9FT ADLT (ELECTROSURGICAL) ×2
ELECTRODE BLDE 4.0 EZ CLN MEGD (MISCELLANEOUS) ×1 IMPLANT
ELECTRODE REM PT RTRN 9FT ADLT (ELECTROSURGICAL) ×2 IMPLANT
EVACUATOR SILICONE 100CC (DRAIN) ×2 IMPLANT
GAUZE PAD ABD 8X10 STRL (GAUZE/BANDAGES/DRESSINGS) ×2 IMPLANT
GAUZE SPONGE 2X2 STRL 8-PLY (GAUZE/BANDAGES/DRESSINGS) ×2 IMPLANT
GLOVE BIO SURGEON STRL SZ 6.5 (GLOVE) IMPLANT
GLOVE BIO SURGEON STRL SZ7.5 (GLOVE) ×1 IMPLANT
GLOVE BIO SURGEON STRL SZ8 (GLOVE) ×1 IMPLANT
GLOVE BIOGEL PI IND STRL 7.0 (GLOVE) IMPLANT
GLOVE BIOGEL PI IND STRL 8 (GLOVE) ×1 IMPLANT
GOWN STRL REUS W/ TWL LRG LVL3 (GOWN DISPOSABLE) IMPLANT
GOWN STRL REUS W/TWL LRG LVL3 (GOWN DISPOSABLE)
GOWN STRL REUS W/TWL XL LVL3 (GOWN DISPOSABLE) ×1 IMPLANT
HEMOSTAT ARISTA ABSORB 3G PWDR (HEMOSTASIS) IMPLANT
HIBICLENS CHG 4% 4OZ BTL (MISCELLANEOUS) ×1 IMPLANT
MARKER SKIN DUAL TIP RULER LAB (MISCELLANEOUS) ×1 IMPLANT
NDL HYPO 25X1 1.5 SAFETY (NEEDLE) IMPLANT
NEEDLE HYPO 25X1 1.5 SAFETY (NEEDLE) IMPLANT
NS IRRIG 1000ML POUR BTL (IV SOLUTION) ×1 IMPLANT
PACK BASIN DAY SURGERY FS (CUSTOM PROCEDURE TRAY) ×1 IMPLANT
PACK UNIVERSAL I (CUSTOM PROCEDURE TRAY) ×1 IMPLANT
PENCIL SMOKE EVACUATOR (MISCELLANEOUS) ×1 IMPLANT
PIN SAFETY STERILE (MISCELLANEOUS) IMPLANT
SLEEVE SCD COMPRESS KNEE MED (STOCKING) ×1 IMPLANT
SPONGE T-LAP 18X18 ~~LOC~~+RFID (SPONGE) ×3 IMPLANT
STAPLER VISISTAT 35W (STAPLE) ×1 IMPLANT
SUT MNCRL AB 3-0 PS2 27 (SUTURE) ×4 IMPLANT
SUT MNCRL AB 4-0 PS2 18 (SUTURE) ×4 IMPLANT
SUT MON AB 2-0 CT1 36 (SUTURE) ×1 IMPLANT
SUT MON AB 5-0 PS2 18 (SUTURE) IMPLANT
SUT SILK 2 0 SH (SUTURE) ×2 IMPLANT
SUT VIC AB 3-0 SH 27 (SUTURE)
SUT VIC AB 3-0 SH 27X BRD (SUTURE) IMPLANT
SYR BULB IRRIG 60ML STRL (SYRINGE) ×1 IMPLANT
TOWEL GREEN STERILE FF (TOWEL DISPOSABLE) ×2 IMPLANT
TRAY DSU PREP LF (CUSTOM PROCEDURE TRAY) ×1 IMPLANT
TUBE CONNECTING 20X1/4 (TUBING) ×1 IMPLANT
UNDERPAD 30X36 HEAVY ABSORB (UNDERPADS AND DIAPERS) ×2 IMPLANT
YANKAUER SUCT BULB TIP NO VENT (SUCTIONS) ×1 IMPLANT

## 2022-10-28 NOTE — Discharge Instructions (Addendum)
No tylenol until 1:00 p.m.   INSTRUCTIONS FOR AFTER BREAST SURGERY   You will likely have some questions about what to expect following your operation.  The following information will help you and your family understand what to expect when you are discharged from the hospital.  It is important to follow these guidelines to help ensure a smooth recovery and reduce complication.  Postoperative instructions include information on: diet, wound care, medications and physical activity.  AFTER SURGERY Expect to go home after the procedure.  In some cases, you may need to spend one night in the hospital for observation.  DIET Breast surgery does not require a specific diet.  However, the healthier you eat the better your body will heal. It is important to increasing your protein intake.  This means limiting the foods with sugar and carbohydrates.  Focus on vegetables and some meat.  If you have liposuction during your procedure be sure to drink water.  If your urine is bright yellow, then it is concentrated, and you need to drink more water.  As a general rule after surgery, you should have 8 ounces of water every hour while awake.  If you find you are persistently nauseated or unable to take in liquids let us know.  NO TOBACCO USE or EXPOSURE.  This will slow your healing process and lead to a wound.  WOUND CARE Leave the binder on at all times except when showering . Use fragrance free soap like Dial, Dove or [place].   After 24 hours you can remove the binder to shower. Once dry apply binder or sports bra. No baths, pools or hot tubs for four weeks. We close your incision to leave the smallest and best-looking scar. No ointment or creams on your incisions for four weeks.  No Neosporin (Too many skin reactions).  A few weeks after surgery you can use Mederma and start massaging the scar. We ask you to wear your binder or sports bra for the first 6 weeks around the clock, including while sleeping. This  provides added comfort and helps reduce the fluid accumulation at the surgery site. NO Ice or heating pads to the operative site.  You have a very high risk of a BURN before you feel the temperature change.  ACTIVITY No heavy lifting until cleared by the doctor.  This usually means no more than a half-gallon of milk.  It is OK to walk and climb stairs. Moving your legs is very important to decrease your risk of a blood clot.  It will also help keep you from getting deconditioned.  Every 1 to 2 hours get up and walk for 5 minutes. This will help with a quicker recovery back to normal.  Let pain be your guide so you don't do too much.  This time is for you to recover.  You will be more comfortable if you sleep and rest with your head elevated either with a few pillows under you or in a recliner.  No stomach sleeping for a three months.  WORK Everyone returns to work at different times. As a rough guide, most people take at least 1 - 2 weeks off prior to returning to work. If you need documentation for your job, give the forms to the front staff at the clinic.  DRIVING Arrange for someone to bring you home from the hospital after your surgery.  You may be able to drive a few days after surgery but not while taking any narcotics or  valium.  BOWEL MOVEMENTS Constipation can occur after anesthesia and while taking pain medication.  It is important to stay ahead for your comfort.  We recommend taking Milk of Magnesia (2 tablespoons; twice a day) while taking the pain pills.  MEDICATIONS You may be prescribed should start after surgery At your preoperative visit for you history and physical you may have been given the following medications: Zofran 4 mg:  This is to treat nausea and vomiting.  You can take this every 6 hours as needed and only if needed. Tramadol 50 mg:  This is only to be used after you have taken the Motrin or the Tylenol. Every 8 hours as needed.   Over the counter Medication to  take: Ibuprofen (Motrin) 600 mg:  Take this every 6 hours.  If you have additional pain then take 500 mg of the Tylenol every 8 hours.  Only take the Norco after you have tried these two. MiraLAX or Milk of Magnesia: Take this according to the bottle if you take the Norco.  WHEN TO CALL Call your surgeon's office if any of the following occur: Fever 101 degrees F or greater Excessive bleeding or fluid from the incision site. Pain that increases over time without aid from the medications Redness, warmth, or pus draining from incision sites Persistent nausea or inability to take in liquids Severe misshapen area that underwent the operation.  Here are some resources for breast cancer patients:  Plastic surgery website: https://www.plasticsurgery.org/for-medical-professionals/education-and-resources/publications/breast-reconstruction-magazine Breast Reconstruction Awareness Campaign:  ChessContest.fr Plastic surgery Implant information:  https://www.plasticsurgery.org/patient-safety/breast-implant-safety    Post Anesthesia Home Care Instructions  Activity: Get plenty of rest for the remainder of the day. A responsible individual must stay with you for 24 hours following the procedure.  For the next 24 hours, DO NOT: -Drive a car -Advertising copywriter -Drink alcoholic beverages -Take any medication unless instructed by your physician -Make any legal decisions or sign important papers.  Meals: Start with liquid foods such as gelatin or soup. Progress to regular foods as tolerated. Avoid greasy, spicy, heavy foods. If nausea and/or vomiting occur, drink only clear liquids until the nausea and/or vomiting subsides. Call your physician if vomiting continues.  Special Instructions/Symptoms: Your throat may feel dry or sore from the anesthesia or the breathing tube placed in your throat during surgery. If this causes discomfort, gargle with warm salt water. The discomfort should  disappear within 24 hours.  If you had a scopolamine patch placed behind your ear for the management of post- operative nausea and/or vomiting:  1. The medication in the patch is effective for 72 hours, after which it should be removed.  Wrap patch in a tissue and discard in the trash. Wash hands thoroughly with soap and water. 2. You may remove the patch earlier than 72 hours if you experience unpleasant side effects which may include dry mouth, dizziness or visual disturbances. 3. Avoid touching the patch. Wash your hands with soap and water after contact with the patch.   Information for Discharge Teaching: EXPAREL (bupivacaine liposome injectable suspension)   Your surgeon or anesthesiologist gave you EXPAREL(bupivacaine) to help control your pain after surgery.  EXPAREL is a local anesthetic that provides pain relief by numbing the tissue around the surgical site. EXPAREL is designed to release pain medication over time and can control pain for up to 72 hours. Depending on how you respond to EXPAREL, you may require less pain medication during your recovery.  Possible side effects: Temporary loss of sensation or  ability to move in the area where bupivacaine was injected. Nausea, vomiting, constipation Rarely, numbness and tingling in your mouth or lips, lightheadedness, or anxiety may occur. Call your doctor right away if you think you may be experiencing any of these sensations, or if you have other questions regarding possible side effects.  Follow all other discharge instructions given to you by your surgeon or nurse. Eat a healthy diet and drink plenty of water or other fluids.  If you return to the hospital for any reason within 96 hours following the administration of EXPAREL, it is important for health care providers to know that you have received this anesthetic. A teal colored band has been placed on your arm with the date, time and amount of EXPAREL you have received in order to  alert and inform your health care providers. Please leave this armband in place for the full 96 hours following administration, and then you may remove the band.

## 2022-10-28 NOTE — Op Note (Signed)
DATE OF OPERATION: 10/28/2022  LOCATION: [facility] operating Room  PREOPERATIVE DIAGNOSIS: Macromastia  POSTOPERATIVE DIAGNOSIS: Same  PROCEDURE: Bilateral breast reduction  SURGEON: Loren Racer, MD  ASSISTANT: Caroline More, primary, Matt Scheeler  EBL: 100 cc  CONDITION: Stable  COMPLICATIONS: None  INDICATION: The patient, Karen Barnett, is a 67 y.o. female born on [birth year], is here for treatment of complaints of upper back and neck pain.  I discussed this with the patient at length including and most specifically the fact that I do not believe that her breasts are causing her upper back and neck pain and that I do not anticipate improvement in her upper back and neck pain secondary to a breast reduction.  She understood and requested that we proceed anyways.   PROCEDURE DETAILS:  The patient was seen prior to surgery and marked.   IV antibiotics were given. The patient was taken to the operating room and given a general anesthetic. A standard time out was performed and all information was confirmed by those in the room. SCDs were placed.   The chest was prepped and draped in usual sterile manner.  Both nipples were marked with a 42 mm cookie cutter and an 8 cm based pedicle was outlined on the chest wall.  The right breast was addressed first.  The pedicle was de-epithelialized sharply.  The electrocautery was used to resect the medial lateral and superior triangles of breast tissue and to develop and thin the superior skin flap.  The breast tissue removed constituted the bulk of the reduction and the total weight was 227 g.  The wound was irrigated and hemostasis achieved.  The T point was reapproximated with a single 2-0 Monocryl suture after infiltrating the subpectoral fascia with Exparel.  Skin edges were tailor tacked in place and the dermis was closed with interrupted and running 3-0 Monocryl sutures and the skin skin edges were closed with a running 4-0 Monocryl  subcuticular stitch.  Attention was turned to the left breast where similar procedure was performed.  The pedicle was de-epithelialized sharply and the breast tissue to be removed in the upper skin flap developed and thinned with the electrocautery.  The total weight removed on the left breast was 225 g.  The the wound was irrigated and hemostasis achieved.  The subpectoral fascia was infiltrated with Exparel.  The T point was approximated with interrupted 2-0 Monocryl suture.  The skin edges tailor tacked in place and the dermis closed with interrupted and running 3-0 Monocryl sutures and skin closed with a running 4-0 Monocryl subcuticular stitch.  All incisions were sealed with Dermabond.  Patient was placed in a supportive garment and awakened from anesthesia and transferred to the recovery room in good condition all instrument needle and sponge counts reported as correct and there were no complications The patient was allowed to wake up and taken to recovery room in stable condition at the end of the case. The family was notified at the end of the case.   The advanced practice practitioner (APP) assisted throughout the case.  The APP was essential in retraction and counter traction when needed to make the case progress smoothly.  This retraction and assistance made it possible to see the tissue plans for the procedure.  The assistance was needed for blood control, tissue re-approximation and assisted with closure of the incision site.

## 2022-10-28 NOTE — Anesthesia Postprocedure Evaluation (Signed)
Anesthesia Post Note  Patient: Karen Barnett  Procedure(s) Performed: MAMMARY REDUCTION  (BREAST) (Bilateral: Breast)     Patient location during evaluation: PACU Anesthesia Type: General Level of consciousness: awake and alert Pain management: pain level controlled Vital Signs Assessment: post-procedure vital signs reviewed and stable Respiratory status: spontaneous breathing, nonlabored ventilation and respiratory function stable Cardiovascular status: blood pressure returned to baseline Postop Assessment: no apparent nausea or vomiting Anesthetic complications: no   No notable events documented.  Last Vitals:  Vitals:   10/28/22 1030 10/28/22 1045  BP: (!) 143/68 134/67  Pulse: 71 74  Resp: 16 17  Temp:    SpO2: 94% 92%    Last Pain:  Vitals:   10/28/22 1045  TempSrc:   PainSc: 0-No pain                 Shanda Howells

## 2022-10-28 NOTE — Transfer of Care (Signed)
Immediate Anesthesia Transfer of Care Note  Patient: Karen Barnett  Procedure(s) Performed: MAMMARY REDUCTION  (BREAST) (Bilateral: Breast)  Patient Location: PACU  Anesthesia Type:General  Level of Consciousness: awake, alert , and oriented  Airway & Oxygen Therapy: Patient Spontanous Breathing and Patient connected to face mask oxygen  Post-op Assessment: Report given to RN and Post -op Vital signs reviewed and stable  Post vital signs: Reviewed and stable  Last Vitals:  Vitals Value Taken Time  BP 152/69 10/28/22 1012  Temp    Pulse 73 10/28/22 1015  Resp 16 10/28/22 1015  SpO2 100 % 10/28/22 1015  Vitals shown include unvalidated device data.  Last Pain:  Vitals:   10/28/22 0649  TempSrc: Oral  PainSc: 0-No pain      Patients Stated Pain Goal: 5 (10/28/22 4696)  Complications: No notable events documented.

## 2022-10-28 NOTE — Anesthesia Procedure Notes (Signed)
Procedure Name: Intubation Date/Time: 10/28/2022 7:39 AM  Performed by: Burna Cash, CRNAPre-anesthesia Checklist: Patient identified, Emergency Drugs available, Suction available and Patient being monitored Patient Re-evaluated:Patient Re-evaluated prior to induction Oxygen Delivery Method: Circle system utilized Preoxygenation: Pre-oxygenation with 100% oxygen Induction Type: IV induction Ventilation: Mask ventilation without difficulty Laryngoscope Size: Mac and 3 Grade View: Grade I Tube type: Oral Tube size: 7.0 mm Number of attempts: 1 Airway Equipment and Method: Stylet and Oral airway Placement Confirmation: ETT inserted through vocal cords under direct vision, positive ETCO2 and breath sounds checked- equal and bilateral Secured at: 21 cm Tube secured with: Tape Dental Injury: Teeth and Oropharynx as per pre-operative assessment

## 2022-10-28 NOTE — Interval H&P Note (Signed)
History and Physical Interval Note: Pt met in pre op, no change in physical exam or indication for surgery. Marked for a breast reduction with her concurrence. Re-iterated that I do not believe her back and shoulder pain will improved with this procedure. All questions answered. Will proceed at her request.  10/28/2022 7:09 AM  Karen Barnett  has presented today for surgery, with the diagnosis of macromastia.  The various methods of treatment have been discussed with the patient and family. After consideration of risks, benefits and other options for treatment, the patient has consented to  Procedure(s): MAMMARY REDUCTION  (BREAST) (Bilateral) as a surgical intervention.  The patient's history has been reviewed, patient examined, no change in status, stable for surgery.  I have reviewed the patient's chart and labs.  Questions were answered to the patient's satisfaction.     Santiago Glad

## 2022-10-29 ENCOUNTER — Encounter (HOSPITAL_BASED_OUTPATIENT_CLINIC_OR_DEPARTMENT_OTHER): Payer: Self-pay | Admitting: Plastic Surgery

## 2022-10-29 ENCOUNTER — Ambulatory Visit (INDEPENDENT_AMBULATORY_CARE_PROVIDER_SITE_OTHER): Payer: Medicare HMO | Admitting: Student

## 2022-10-29 DIAGNOSIS — N62 Hypertrophy of breast: Secondary | ICD-10-CM

## 2022-10-29 LAB — SURGICAL PATHOLOGY

## 2022-10-29 NOTE — Progress Notes (Signed)
Patient is a 67 year old female who is postop day 1 from bilateral breast reduction.  Intraoperatively, patient did not have drains placed during the surgery.  Patient reports that she is doing well after surgery.  She states that she did take 1 pain pill, but then has been taking Aleve for pain control and has been doing well.  She denies any nausea or vomiting.  She denies any drainage from her incisions.  She denies any acute events overnight.  I discussed with the patient that given that she did not have drains placed during surgery yesterday, she does not have to come in for her appointment today.  I discussed with her she may wait until next week to be seen.  I did discuss with her though that if she would like to be seen today, I be more than happy to examine her.  Patient stated that she will come in next week for her scheduled appointment.  All of the patient's questions were answered to her satisfaction.  I instructed the patient to call if she has any questions or concerns before her appointment next week.

## 2022-11-05 ENCOUNTER — Ambulatory Visit (INDEPENDENT_AMBULATORY_CARE_PROVIDER_SITE_OTHER): Payer: Medicare HMO | Admitting: Plastic Surgery

## 2022-11-05 ENCOUNTER — Encounter: Payer: Medicare HMO | Admitting: Plastic Surgery

## 2022-11-05 VITALS — BP 145/78 | HR 82

## 2022-11-05 DIAGNOSIS — Z9889 Other specified postprocedural states: Secondary | ICD-10-CM

## 2022-11-05 NOTE — Progress Notes (Signed)
Karen Barnett presents today 8 days postop from a bilateral breast reduction.  She is doing well with no complaints.  She has not taken any pain medicine for the past 2 days.  Of note she does feel that her neck pain has improved.  On examination she has an outstanding result.  Incisions are clean dry and intact and the nipples are well-perfused.  She is encouraged to begin scar massage next week.  She may begin to slowly increase activity and she may switch to a sports bra whenever she likes.  Keep scheduled follow-up.

## 2022-11-20 ENCOUNTER — Ambulatory Visit: Payer: Medicare HMO | Admitting: Plastic Surgery

## 2022-11-20 ENCOUNTER — Encounter: Payer: Self-pay | Admitting: Plastic Surgery

## 2022-11-20 VITALS — BP 127/72 | HR 71 | Ht 59.0 in | Wt 100.4 lb

## 2022-11-20 DIAGNOSIS — Z9889 Other specified postprocedural states: Secondary | ICD-10-CM

## 2022-11-20 NOTE — Progress Notes (Signed)
Patient is a 67 year old female who underwent bilateral breast reduction with Dr. Ladona Ridgel on 10/28/2022.  She is a little over 3 weeks postop.  She presents to the clinic today for postoperative follow-up.  Patient was last seen in the clinic on 12/02/2022.  At this visit, patient was doing well.  On exam, patient has an outstanding result.  Incisions were clean dry and intact.  Nipples were well-perfused.  Plan was for patient to begin scar massage in 1 week and to slowly increase her activities.  Today, she is doing well.  She is very happy with her overall results.  She has begun scar massage.  She is increasing her activity slowly.  On examination the incisions are clean dry and intact nipples are well-perfused and her shape and size are outstanding.  Will continue to slowly increase activity.  May switch to a sports bra if she likes.  May begin lifting her grandchildren.  Will return in 1 to 2 months.  Photographs were obtained today with her consent.

## 2022-11-26 ENCOUNTER — Encounter: Payer: Self-pay | Admitting: Family Medicine

## 2022-11-26 ENCOUNTER — Ambulatory Visit (INDEPENDENT_AMBULATORY_CARE_PROVIDER_SITE_OTHER): Payer: Medicare HMO | Admitting: Family Medicine

## 2022-11-26 VITALS — BP 115/70 | HR 69 | Temp 98.6°F | Ht 59.0 in | Wt 97.0 lb

## 2022-11-26 DIAGNOSIS — J029 Acute pharyngitis, unspecified: Secondary | ICD-10-CM | POA: Diagnosis not present

## 2022-11-26 DIAGNOSIS — J069 Acute upper respiratory infection, unspecified: Secondary | ICD-10-CM | POA: Diagnosis not present

## 2022-11-26 LAB — STREP GROUP A AG, W/REFLEX TO CULT: Streptococcus Group A AG: NOT DETECTED

## 2022-11-26 NOTE — Progress Notes (Signed)
Subjective:  HPI: Karen Barnett is a 67 y.o. female presenting on 11/26/2022 for Follow-up (sore throat; gland on right side of neck swollen; not feeling well - JBG\\\)   HPI Patient is in today for 4 days of sore throat, rhinorrhea, fatigue, and right neck soreness. Denies fever, chills, body aches, congestion, cough, N/V/D. No known sick exposures Has tried salt water rinses.  Review of Systems  All other systems reviewed and are negative.   Relevant past medical history reviewed and updated as indicated.   Past Medical History:  Diagnosis Date   Anxiety    situational   Crohn's colitis (HCC)    Crohn's disease (HCC)    Osteoporosis    Popliteal aneurysm (HCC)    Right     Past Surgical History:  Procedure Laterality Date   ABDOMINAL AORTAGRAM     ABDOMINAL AORTAGRAM N/A 08/01/2014   Procedure: ABDOMINAL Ronny Flurry;  Surgeon: Nada Libman, MD;  Location[facility] C[facility] LAB;  Service: Cardiovascular;  Laterality: N/A;   BREAST REDUCTION SURGERY Bilateral 10/28/2022   Procedure: MAMMARY REDUCTION  (BREAST);  Surgeon: Santiago Glad, MD;  Location: [facility];  Service: Plastics;  Laterality: Bilateral;   COLON SURGERY     COLONOSCOPY W/ BIOPSIES AND POLYPECTOMY     DILATION AND CURETTAGE OF UTERUS     FEMORAL-POPLITEAL BYPASS GRAFT Right 08/02/2014   Procedure: Resection Right popliteal artery aneurysm with Insertion of Reversed Saphenous Vein Graft Above Knee to Above Knee Popliteal;  Surgeon: Pryor Ochoa, MD;  Location: [facility] OR;  Service: Vascular;  Laterality: Right;   VEIN HARVEST Right 08/02/2014   Procedure: Saphenous VEIN HARVEST Right Leg;  Surgeon: Pryor Ochoa, MD;  Location: [facility] OR;  Service: Vascular;  Laterality: Right;   WISDOM TOOTH EXTRACTION      Allergies and medications reviewed and updated.   Current Outpatient Medications:    aspirin EC 81 MG tablet, Take 1 tablet (81 mg total) by mouth daily., Disp: , Rfl:    cyanocobalamin  (VITAMIN B12) 1000 MCG/ML injection, Inject 1 ml into the muscle once a month., Disp: 3 mL, Rfl: 11   denosumab (PROLIA) 60 MG/ML SOSY injection, Inject 60 mg into the skin every 6 (six) months., Disp: , Rfl:   No Known Allergies  Objective:   BP 115/70   Pulse 69   Temp 98.6 F (37 C) (Oral)   Ht 4\' 11"  (1.499 m)   Wt 97 lb (44 kg)   SpO2 97%   BMI 19.59 kg/m      11/26/2022    4:04 PM 11/20/2022    9:26 AM 11/05/2022    8:41 AM  Vitals with BMI  Height 4\' 11"  4\' 11"    Weight 97 lbs 100 lbs 6 oz   BMI 19.58 20.27   Systolic 115 127 161  Diastolic 70 72 78  Pulse 69 71 82     Physical Exam Vitals and nursing note reviewed.  Constitutional:      Appearance: Normal appearance. She is normal weight.  HENT:     Head: Normocephalic and atraumatic.     Right Ear: Tympanic membrane, ear canal and external ear normal.     Left Ear: Tympanic membrane, ear canal and external ear normal.     Nose: Nose normal.     Mouth/Throat:     Mouth: Mucous membranes are moist.     Pharynx: Oropharynx is clear.  Eyes:     Conjunctiva/sclera: Conjunctivae  normal.  Musculoskeletal:     Cervical back: Tenderness present.  Lymphadenopathy:     Cervical: No cervical adenopathy.  Skin:    General: Skin is warm and dry.  Neurological:     General: No focal deficit present.     Mental Status: She is alert and oriented to person, place, and time. Mental status is at baseline.  Psychiatric:        Mood and Affect: Mood normal.        Behavior: Behavior normal.        Thought Content: Thought content normal.        Judgment: Judgment normal.     Assessment & Plan:  Viral URI Assessment & Plan: Reassured patient that symptoms and exam findings are most consistent with a viral upper respiratory infection and explained lack of efficacy of antibiotics against viruses.  Discussed expected course and features suggestive of secondary bacterial infection.  Continue supportive care. Increase fluid  intake with water or electrolyte solution like pedialyte. Encouraged acetaminophen as needed for fever/pain. Encouraged salt water gargling, chloraseptic spray and throat lozenges. Encouraged OTC guaifenesin. Encouraged saline sinus flushes and/or neti with humidified air.    Orders: -     STREP GROUP A AG, W/REFLEX TO CULT; Future  Sore throat     Follow up plan: Return if symptoms worsen or fail to improve.  Park Meo, FNP

## 2022-11-26 NOTE — Assessment & Plan Note (Signed)

## 2022-11-27 ENCOUNTER — Ambulatory Visit: Payer: Medicare HMO | Admitting: Family Medicine

## 2022-12-08 DIAGNOSIS — M810 Age-related osteoporosis without current pathological fracture: Secondary | ICD-10-CM | POA: Diagnosis not present

## 2022-12-16 DIAGNOSIS — M810 Age-related osteoporosis without current pathological fracture: Secondary | ICD-10-CM | POA: Diagnosis not present

## 2022-12-24 ENCOUNTER — Ambulatory Visit: Payer: Medicare HMO | Admitting: Student

## 2022-12-24 DIAGNOSIS — E538 Deficiency of other specified B group vitamins: Secondary | ICD-10-CM | POA: Insufficient documentation

## 2022-12-24 DIAGNOSIS — Z01419 Encounter for gynecological examination (general) (routine) without abnormal findings: Secondary | ICD-10-CM | POA: Diagnosis not present

## 2022-12-24 DIAGNOSIS — Z9889 Other specified postprocedural states: Secondary | ICD-10-CM

## 2022-12-24 DIAGNOSIS — Z681 Body mass index (BMI) 19 or less, adult: Secondary | ICD-10-CM | POA: Diagnosis not present

## 2022-12-24 NOTE — Progress Notes (Signed)
Patient is a 67 year old female who underwent bilateral breast reduction Dr. Ladona Ridgel on 10/28/2022.  She is 8 weeks postop.  She presents to the clinic today for postoperative follow-up.  Patient was last seen in the clinic on 11/20/2022.  At this visit, patient was doing well.  On exam, the incisions were clean dry and intact and nipples were well-perfused.  Her size and shape were outstanding.  Plan was for patient to slowly increase activities.  Plan is for patient to follow-up in 1 to 2 months.  Today, patient reports she is doing well.  She states that she notices a little bit of "puffiness" to the right lateral aspect of her breast.  She denies any tenderness over the area.  She denies any redness over the area.  She denies any other issues or concerns with her breast.  She denies any fevers or chills.  She denies any drainage.  Chaperone present on exam.  On exam, patient is sitting upright in no acute distress.  Breasts are soft and symmetric.  There is no overlying erythema or ecchymosis to either breast.  No significant fluid collections palpated on exam.  NAC's are viable bilaterally.  Incisions are intact bilaterally.  There is no significant bulging or deformities noted to the right lateral breast.  There is a little bit of firmness noted to the right lower quadrant of the breast.  This appears to be is consistent with some scar tissue.  There are no signs of infection on exam.  Discussed with patient would like her to massage the area of firmness at least twice per day.  Discussed with her would like her to closely monitor it.  Discussed with her its most likely some scar tissue, but if it changes in size, if she notices any overlying skin changes or has any concerns to let us know.  Patient expressed understanding.  Discussed with her that if it is still present in several months, we may consider getting imaging later on.  Patient expressed understanding.  Discussed with the patient that she no  longer has to wear compression and she can wear regular bra with no underwire.  Discussed with her that she can gradually increase her activities.  Patient expressed understanding.  Discussed with patient that it does not appear that there is any fluid collections on exam.  There are no dogears on exam.  Gust with her to continue to monitor her breasts and if she notices any changes to give [place] a call.  Patient expressed understanding.  Patient to follow back up in 2 months.  Instructed patient to call in the meantime she is any questions or concerns about anything.

## 2023-01-13 ENCOUNTER — Ambulatory Visit (INDEPENDENT_AMBULATORY_CARE_PROVIDER_SITE_OTHER): Payer: Medicare HMO | Admitting: Family Medicine

## 2023-01-13 VITALS — BP 122/78 | HR 70 | Temp 97.6°F | Ht 59.0 in | Wt 98.0 lb

## 2023-01-13 DIAGNOSIS — Z0001 Encounter for general adult medical examination with abnormal findings: Secondary | ICD-10-CM | POA: Diagnosis not present

## 2023-01-13 DIAGNOSIS — Z0000 Encounter for general adult medical examination without abnormal findings: Secondary | ICD-10-CM

## 2023-01-13 DIAGNOSIS — R5383 Other fatigue: Secondary | ICD-10-CM

## 2023-01-13 DIAGNOSIS — K5010 Crohn's disease of large intestine without complications: Secondary | ICD-10-CM | POA: Diagnosis not present

## 2023-01-13 NOTE — Progress Notes (Signed)
Subjective:    Patient ID: Karen Barnett, female    DOB: 03-Apr-1956, 67 y.o.   MRN: [id number]   Patient is a very sweet 67 year old Caucasian female who presents today complaining of fatigue.  She is here today for her wellness visit.  She is here today for her wellness visit.  In the last few years, she has couple losses.  She lost her husband in 2021.  She lost her father last year.  She is now having to help care for her mother who is battling severe depression having lost her husband of 70 years.  She also has some mild age-related memory loss and therefore the patient is having to do more to care for her mother.  She is also having to watch after her granddaughter who is a-year-old.  All of these things are very stressful for her and contribute to the fatigue.  She is concerned about memory loss.  She recently went to a funeral.  She asked another friend at the funeral if a mutual relative could attend the funeral.  However that person had passed away and she had no recollection of it.  This has her very concerned about her memory.  She denies forgetting conversations.  She denies forgetting how to use common every day items.  She denies any apraxia or aphasia.  She is very concerned and worried however that she may be developing memory issues.  She denies that any member of her family has voiced any concerns about her memory.  She denies any personality changes.  She does endorse some sadness and anxiety Past Medical History:  Diagnosis Date   Anxiety    situational   Crohn's colitis (HCC)    Crohn's disease (HCC)    Osteoporosis    Popliteal aneurysm (HCC)    Right   Past Surgical History:  Procedure Laterality Date   ABDOMINAL AORTAGRAM     ABDOMINAL AORTAGRAM N/A 08/01/2014   Procedure: ABDOMINAL Ronny Flurry;  Surgeon: Nada Libman, MD;  Location: [facility] CATH LAB;  Service: Cardiovascular;  Laterality: N/A;   BREAST REDUCTION SURGERY Bilateral 10/28/2022   Procedure: MAMMARY REDUCTION  (BREAST);  Surgeon: Santiago Glad, MD;   Location: [facility];  Service: Plastics;  Laterality: Bilateral;   COLON SURGERY     COLONOSCOPY W/ BIOPSIES AND POLYPECTOMY     DILATION AND CURETTAGE OF UTERUS     FEMORAL-POPLITEAL BYPASS GRAFT Right 08/02/2014   Procedure: Resection Right popliteal artery aneurysm with Insertion of Reversed Saphenous Vein Graft Above Knee to Above Knee Popliteal;  Surgeon: Pryor Ochoa, MD;  Location: [facility] OR;  Service: Vascular;  Laterality: Right;   VEIN HARVEST Right 08/02/2014   Procedure: Saphenous VEIN HARVEST Right Leg;  Surgeon: Pryor Ochoa, MD;  Location: [facility] OR;  Service: Vascular;  Laterality: Right;   WISDOM TOOTH EXTRACTION     Current Outpatient Medications on File Prior to Visit  Medication Sig Dispense Refill   aspirin EC 81 MG tablet Take 1 tablet (81 mg total) by mouth daily.     cyanocobalamin (VITAMIN B12) 1000 MCG/ML injection Inject 1 ml into the muscle once a month. 3 mL 11   denosumab (PROLIA) 60 MG/ML SOSY injection Inject 60 mg into the skin every 6 (six) months.     No current facility-administered medications on file prior to visit.   No Known Allergies Social History   Socioeconomic History   Marital status: Widowed    Spouse name: Not on file   Number of children: Not on  file   Years of education: Not on file   Highest education level: Not on file  Occupational History   Not on file  Tobacco Use   Smoking status: Former    Current packs/day: 0.00    Types: Cigarettes    Quit date: 02/14/2009    Years since quitting: 13.9   Smokeless tobacco: Never  Vaping Use   Vaping status: Never Used  Substance and Sexual Activity   Alcohol use: Not Currently    Alcohol/week: 14.0 standard drinks of alcohol    Types: 14 Standard drinks or equivalent per week   Drug use: Yes    Types: Marijuana   Sexual activity: Yes  Other Topics Concern   Not on file  Social History Narrative   Husband passed away in 02/03/2020.    Father passed away 30-Jun-2021.    Social Determinants of Health   Financial Resource Strain: Not on file  Food Insecurity: Not on file  Transportation Needs: Not on file  Physical Activity: Not on file  Stress: Not on file  Social Connections: Not on file  Intimate Partner Violence: Not on file   Family History  Problem Relation Age of Onset   Diabetes Father    Diabetes Brother    Diabetes Brother      Review of Systems  All other systems reviewed and are negative.      Objective:   Physical Exam Vitals reviewed.  Constitutional:      General: She is not in acute distress.    Appearance: Normal appearance. She is well-developed and normal weight. She is not diaphoretic.  HENT:     Right Ear: Tympanic membrane and external ear normal. There is no impacted cerumen.     Left Ear: Tympanic membrane and external ear normal. There is no impacted cerumen.     Nose: Nose normal. No congestion or rhinorrhea.     Mouth/Throat:     Mouth: Mucous membranes are moist.     Pharynx: Oropharynx is clear. No oropharyngeal exudate.  Eyes:     Conjunctiva/sclera: Conjunctivae normal.     Pupils: Pupils are equal, round, and reactive to light.  Neck:     Thyroid: No thyromegaly.     Vascular: No JVD.  Cardiovascular:     Rate and Rhythm: Normal rate and regular rhythm.     Pulses: Normal pulses.     Heart sounds: Normal heart sounds. No murmur heard.    No friction rub. No gallop.  Pulmonary:     Effort: Pulmonary effort is normal. No respiratory distress.     Breath sounds: Normal breath sounds. No wheezing or rales.  Chest:     Chest wall: No tenderness.  Abdominal:     General: Bowel sounds are normal. There is no distension.     Palpations: Abdomen is soft.     Tenderness: There is no abdominal tenderness. There is no guarding or rebound.  Musculoskeletal:     Cervical back: Neck supple.     Right lower leg: No edema.     Left lower leg: No edema.  Lymphadenopathy:     Cervical: No cervical  adenopathy.  Skin:    Coloration: Skin is not jaundiced.     Findings: No bruising, erythema or lesion.  Neurological:     General: No focal deficit present.     Mental Status: She is alert and oriented to person, place, and time. Mental status is at baseline.     Cranial  Nerves: No cranial nerve deficit.     Sensory: No sensory deficit.     Motor: No weakness.     Coordination: Coordination normal.     Gait: Gait normal.     Deep Tendon Reflexes: Reflexes normal.  Psychiatric:        Mood and Affect: Mood normal.        Behavior: Behavior normal.        Thought Content: Thought content normal.        Judgment: Judgment normal.         Assessment & Plan:  Fatigue, unspecified type - Plan: CBC with Differential/Platelet, COMPLETE METABOLIC PANEL WITH GFR, TSH, Vitamin B12, Iron, Sedimentation rate  General medical exam  Crohn's disease of colon without complication (HCC) Her physical exam today is completely normal.  Her review of systems is also normal.  She denies any fevers or chills, neck again weight loss, melena or hematochezia.  She denies any nausea or vomiting or cough or chest pain or pleurisy or hemoptysis.  Aside from the one isolated episode with her memory, there have been no other issues.  Therefore I will start with some basic blood work including a CBC CMP TSH and a B12 and iron level and sedimentation rate.  If all of these labs are normal, her physical exam today is normal, I feel the majority of this could be stress related.  We will continue to monitor for any progression of her memory loss however today her mental status exam is normal.

## 2023-01-14 LAB — COMPLETE METABOLIC PANEL WITH GFR
AG Ratio: 2.4 (calc) (ref 1.0–2.5)
ALT: 32 U/L — ABNORMAL HIGH (ref 6–29)
AST: 29 U/L (ref 10–35)
Albumin: 4.4 g/dL (ref 3.6–5.1)
Alkaline phosphatase (APISO): 40 U/L (ref 37–153)
BUN: 9 mg/dL (ref 7–25)
CO2: 31 mmol/L (ref 20–32)
Calcium: 9.7 mg/dL (ref 8.6–10.4)
Chloride: 102 mmol/L (ref 98–110)
Creat: 0.62 mg/dL (ref 0.50–1.05)
Globulin: 1.8 g/dL — ABNORMAL LOW (ref 1.9–3.7)
Glucose, Bld: 96 mg/dL (ref 65–99)
Potassium: 5 mmol/L (ref 3.5–5.3)
Sodium: 139 mmol/L (ref 135–146)
Total Bilirubin: 0.4 mg/dL (ref 0.2–1.2)
Total Protein: 6.2 g/dL (ref 6.1–8.1)
eGFR: 99 mL/min/{1.73_m2} (ref >=60)

## 2023-01-14 LAB — CBC WITH DIFFERENTIAL/PLATELET
Absolute Monocytes: 765 {cells}/uL (ref 200–950)
Basophils Absolute: 34 {cells}/uL (ref 0–200)
Basophils Relative: 0.4 %
Eosinophils Absolute: 77 {cells}/uL (ref 15–500)
Eosinophils Relative: 0.9 %
HCT: 39.6 % (ref 35.0–45.0)
Hemoglobin: 13.3 g/dL (ref 11.7–15.5)
Lymphs Abs: 2915 {cells}/uL (ref 850–3900)
MCH: 31.2 pg (ref 27.0–33.0)
MCHC: 33.6 g/dL (ref 32.0–36.0)
MCV: 93 fL (ref 80.0–100.0)
MPV: 10.9 fL (ref 7.5–12.5)
Monocytes Relative: 8.9 %
Neutro Abs: 4807 {cells}/uL (ref 1500–7800)
Neutrophils Relative %: 55.9 %
Platelets: 307 10*3/uL (ref 140–400)
RBC: 4.26 10*6/uL (ref 3.80–5.10)
RDW: 13 % (ref 11.0–15.0)
Total Lymphocyte: 33.9 %
WBC: 8.6 10*3/uL (ref 3.8–10.8)

## 2023-01-14 LAB — VITAMIN B12: Vitamin B-12: 324 pg/mL (ref 200–1100)

## 2023-01-14 LAB — IRON: Iron: 141 ug/dL (ref 45–160)

## 2023-01-14 LAB — TSH: TSH: 1.39 m[IU]/L (ref 0.40–4.50)

## 2023-01-14 LAB — SEDIMENTATION RATE: Sed Rate: 6 mm/h (ref 0–30)

## 2023-02-24 ENCOUNTER — Ambulatory Visit: Payer: Medicare HMO | Admitting: Student

## 2023-02-24 ENCOUNTER — Encounter: Payer: Self-pay | Admitting: Student

## 2023-02-24 VITALS — BP 125/79 | HR 73

## 2023-02-24 DIAGNOSIS — Z9889 Other specified postprocedural states: Secondary | ICD-10-CM

## 2023-02-24 DIAGNOSIS — N6459 Other signs and symptoms in breast: Secondary | ICD-10-CM

## 2023-02-24 NOTE — Progress Notes (Signed)
   Referring Provider Donita Brooks, MD [address] [place],  [place] [address]   CC:  Chief Complaint  Patient presents with   Post-op Follow-up      Karen Barnett is an 67 y.o. female.  HPI: Patient is a 67 year old female who underwent bilateral breast reduction with Dr. Ladona Ridgel on 10/28/2022.  She is about 4 months postop.  She presents to the clinic today for further follow-up.  Patient was last seen in the clinic on 12/24/2022.  At this visit, patient was doing well.  She stated she noticed a little bit of puffiness to the right lateral aspect of her breast.  She denied any tenderness.  On exam, breasts were soft and symmetric.  Incisions were intact bilaterally.  There were no significant bulging or deformities to the right lateral breast.  There was a little bit of firmness noted to the right lower quadrant of the breast.  Appear to be consistent with some scar tissue.  Plan is for patient to be felt to continue to bother it.  Plan was for her to follow back up in 2 months.  Today, patient reports she is doing really well.  She states that she sometimes has a little bit of nerve type pain, denies any other issues or concerns.  States she is overall happy with her result.  States that her back pain has significantly improved.  Reports she has been applying Mederma to her incisions.  Review of Systems General: Denies any fevers or chills  Physical Exam    02/24/2023    1:08 PM 01/13/2023    3:45 PM 11/26/2022    4:04 PM  Vitals with BMI  Height  4\' 11"  4\' 11"   Weight  98 lbs 97 lbs  BMI  19.78 19.58  Systolic 125 122 604  Diastolic 79 78 70  Pulse 73 70 69    General:  No acute distress,  Alert and oriented, Non-Toxic, Normal speech and affect Chaperone present on exam.  On exam, patient is sitting upright in no acute distress.  Breasts are soft bilaterally.  There is some very mild firmness noted out laterally bilaterally, most likely consistent with some scar  tissue.  They have good shape and symmetry.  NAC's are healthy bilaterally.  Incisions are well-healed.  There is no overlying erythema.  No fluid collections on exam.  Assessment/Plan  S/P bilateral breast reduction   Discussed with patient that she has a great result.  Recommended continued massage of her breasts and continue use of scar creams.  Discussed with her that if her firmness persists and does not soften up, to let us know.  Patient expressed understanding.  Patient to follow-up as needed.   I instructed the patient to call if she has any questions or concerns about anything.  Pictures were obtained of the patient and placed in the chart with the patient's or guardian's permission.   Laurena Spies 02/24/2023, 1:24 PM

## 2023-06-04 ENCOUNTER — Ambulatory Visit
Admission: EM | Admit: 2023-06-04 | Discharge: 2023-06-04 | Disposition: A | Discharge status: Home / Self Care | Payer: Medicare HMO | Attending: Family Medicine | Admitting: Family Medicine

## 2023-06-04 DIAGNOSIS — H0012 Chalazion right lower eyelid: Secondary | ICD-10-CM | POA: Diagnosis not present

## 2023-06-04 MED ORDER — ERYTHROMYCIN 5 MG/GM OP OINT: TOPICAL_OINTMENT | OPHTHALMIC | 0 refills | Status: AC

## 2023-06-04 NOTE — ED Triage Notes (Signed)
T reports she has a "knot" under her right eye x 1 month.

## 2023-06-04 NOTE — ED Provider Notes (Signed)
[facility]    CSN: [id number] Arrival date & time: 06/04/23  1321      History   Chief Complaint No chief complaint on file.   HPI Karen Barnett is a 68 y.o. female.   Presenting today with a minimally painful hard bump to the right lower eyelid for the past month or so.  Denies known injury to the area, new products or medications used, eye redness pain visual change, headache, fevers, drainage.  So far not trying anything over-the-counter for symptoms.    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   Popliteal aneurysm (HCC) 08/02/2014   Crohn's colitis (HCC)     Past Surgical History:  Procedure Laterality Date   ABDOMINAL AORTAGRAM     ABDOMINAL AORTAGRAM N/A 08/01/2014   Procedure: ABDOMINAL Ronny Flurry;  Surgeon: Nada Libman, MD;  Location: [facility] CATH LAB;  Service: Cardiovascular;  Laterality: N/A;   BREAST REDUCTION SURGERY Bilateral 10/28/2022   Procedure: MAMMARY REDUCTION  (BREAST);  Surgeon: Santiago Glad, MD;  Location: [facility];  Service: Plastics;  Laterality: Bilateral;   COLON SURGERY     COLONOSCOPY W/ BIOPSIES AND POLYPECTOMY     DILATION AND CURETTAGE OF UTERUS     FEMORAL-POPLITEAL BYPASS GRAFT Right 08/02/2014   Procedure: Resection Right popliteal artery aneurysm with Insertion of Reversed Saphenous Vein Graft Above Knee to Above Knee Popliteal;  Surgeon: Pryor Ochoa, MD;  Location: [facility] OR;  Service: Vascular;  Laterality: Right;   VEIN HARVEST Right 08/02/2014   Procedure: Saphenous VEIN HARVEST Right Leg;  Surgeon: Pryor Ochoa, MD;  Location: [facility] OR;  Service: Vascular;  Laterality: Right;   WISDOM TOOTH EXTRACTION      OB History   No obstetric history on  file.      Home Medications    Prior to Admission medications   Medication Sig Start Date End Date Taking? Authorizing Provider  erythromycin ophthalmic ointment Place a 1/2 inch ribbon of ointment into the right lower eyelid BID. 06/04/23  Yes Particia Nearing, PA-C  aspirin EC 81 MG tablet Take 1 tablet (81 mg total) by mouth daily. 08/04/14   Rhyne, Ames Coupe, PA-C  cyanocobalamin (VITAMIN B12) 1000 MCG/ML injection Inject 1 ml into the muscle once a month. 06/13/22   Donita Brooks, MD  denosumab (PROLIA) 60 MG/ML SOSY injection Inject 60 mg into the skin every 6 (six) months.    [provider]    Family History Family History  Problem Relation Age of Onset   Diabetes Father    Diabetes Brother    Diabetes Brother     Social History Social History   Tobacco Use   Smoking status: Former    Current packs/day: 0.00    Types: Cigarettes    Quit date: 02/14/2009    Years since quitting: 14.3   Smokeless tobacco: Never  Vaping Use   Vaping status: Never Used  Substance Use Topics   Alcohol use: Not Currently    Alcohol/week: 14.0 standard drinks of alcohol    Types: 14 Standard drinks or equivalent per week   Drug use: Yes    Types:  Marijuana     Allergies   Patient has no known allergies.   Review of Systems Review of Systems Per HPI  Physical Exam Triage Vital Signs ED Triage Vitals  Encounter Vitals Group     BP 06/04/23 1418 122/80     Systolic BP Percentile --      Diastolic BP Percentile --      Pulse Rate 06/04/23 1418 70     Resp 06/04/23 1418 16     Temp 06/04/23 1418 98 F (36.7 C)     Temp Source 06/04/23 1418 Oral     SpO2 06/04/23 1418 98 %     Weight --      Height --      Head Circumference --      Peak Flow --      Pain Score 06/04/23 1419 6     Pain Loc --      Pain Education --      Exclude from Growth Chart --    No data found.  Updated Vital Signs BP 122/80 (BP Location: Right Arm)   Pulse 70   Temp 98 F  (36.7 C) (Oral)   Resp 16   SpO2 98%   Visual Acuity Right Eye Distance:   Left Eye Distance:   Bilateral Distance:    Right Eye Near:   Left Eye Near:    Bilateral Near:     Physical Exam Vitals and nursing note reviewed.  Constitutional:      Appearance: Normal appearance. She is not ill-appearing.  HENT:     Head: Atraumatic.  Eyes:     Extraocular Movements: Extraocular movements intact.     Pupils: Pupils are equal, round, and reactive to light.     Comments: semifirm smooth mass present to the right lower eyelid, minimally tender to palpation, nonfluctuant, nonindurated  Cardiovascular:     Rate and Rhythm: Normal rate and regular rhythm.     Heart sounds: Normal heart sounds.  Pulmonary:     Effort: Pulmonary effort is normal.     Breath sounds: Normal breath sounds.  Musculoskeletal:        General: Normal range of motion.     Cervical back: Normal range of motion and neck supple.  Skin:    General: Skin is warm and dry.  Neurological:     Mental Status: She is alert and oriented to person, place, and time.  Psychiatric:        Mood and Affect: Mood normal.        Thought Content: Thought content normal.        Judgment: Judgment normal.      UC Treatments / Results  Labs (all labs ordered are listed, but only abnormal results are displayed) Labs Reviewed - No data to display  EKG   Radiology No results found.  Procedures Procedures (including critical care time)  Medications Ordered in UC Medications - No data to display  Initial Impression / Assessment and Plan / UC Course  I have reviewed the triage vital signs and the nursing notes.  Pertinent labs & imaging results that were available during my care of the patient were reviewed by me and considered in my medical decision making (see chart for details).     Suspect chalazion, will treat with erythromycin ointment, warm compresses in case this helps with symptoms and protects the eye  from becoming infected while the area is inflamed but did discuss that these sometimes go away  on their own but often times need to be drained by the ophthalmologist.  Resources given.  Return for worsening symptoms.  Final Clinical Impressions(s) / UC Diagnoses   Final diagnoses:  Chalazion of right lower eyelid   Discharge Instructions   None    ED Prescriptions     Medication Sig Dispense Auth. Provider   erythromycin ophthalmic ointment Place a 1/2 inch ribbon of ointment into the right lower eyelid BID. 3.5 g Particia Nearing, PA-C      PDMP not reviewed this encounter.   Particia Nearing, [place] 06/04/23 1955

## 2023-06-22 DIAGNOSIS — M810 Age-related osteoporosis without current pathological fracture: Secondary | ICD-10-CM | POA: Diagnosis not present

## 2023-07-14 DIAGNOSIS — M810 Age-related osteoporosis without current pathological fracture: Secondary | ICD-10-CM | POA: Diagnosis not present

## 2023-07-15 ENCOUNTER — Telehealth: Payer: Self-pay

## 2023-07-15 NOTE — Telephone Encounter (Signed)
 Copied from CRM [id number]. Topic: General - Other >> Jul 15, 2023  3:50 PM Karen Barnett wrote: Reason for CRM: Patient has been summoned for [employer]. Patient requesting letter from Dr. Tanya Nones excusing her from the Jury Duty due to her medical conditions. Patient summoned to appear on 08/19/2023 in [facility].   Patient will stop by office with letter she received tomorrow.  If additional information is needed patient would like a call back, [phone]

## 2023-07-16 ENCOUNTER — Encounter: Payer: Self-pay | Admitting: Family Medicine

## 2023-07-16 NOTE — Telephone Encounter (Signed)
 Pt aware letter is ready for pickup. Mjp,lpn

## 2023-08-14 ENCOUNTER — Other Ambulatory Visit: Payer: Self-pay | Admitting: Family Medicine

## 2023-08-14 DIAGNOSIS — E538 Deficiency of other specified B group vitamins: Secondary | ICD-10-CM

## 2023-08-14 DIAGNOSIS — K5010 Crohn's disease of large intestine without complications: Secondary | ICD-10-CM

## 2023-08-14 NOTE — Telephone Encounter (Signed)
 Requested Prescriptions  Pending Prescriptions Disp Refills   cyanocobalamin (VITAMIN B12) 1000 MCG/ML injection [Pharmacy Med Name: CYANOCOBALAMIN 1,000 MCG/ML VL] 3 mL 2    Sig: INJECT 1 ML INTO THE MUSCLE ONCE A MONTH.     Endocrinology:  Vitamins - Vitamin B12 Passed - 08/14/2023  1:23 PM      Passed - HCT in normal range and within 360 days    HCT  Date Value Ref Range Status  01/13/2023 39.6 35.0 - 45.0 % Final         Passed - HGB in normal range and within 360 days    Hemoglobin  Date Value Ref Range Status  01/13/2023 13.3 11.7 - 15.5 g/dL Final         Passed - B12 Level in normal range and within 360 days    Vitamin B-12  Date Value Ref Range Status  01/13/2023 324 200 - 1,100 pg/mL Final    Comment:    . Please Note: Although the reference range for vitamin B12 is [phone] pg/mL, it has been reported that between 5 and 10% of patients with values between 200 and 400 pg/mL may experience neuropsychiatric and hematologic abnormalities due to occult B12 deficiency; less than 1% of patients with values above 400 pg/mL will have symptoms. Verna Czech - Valid encounter within last 12 months    Recent Outpatient Visits           7 months ago Fatigue, unspecified type   [place] [facility] Pickard, Priscille Heidelberg, MD   8 months ago Viral URI   [place] [facility] Park Meo, FNP   1 year ago Cat scratch of left hand, initial encounter   [place] [facility] Donita Brooks, MD   1 year ago General medical exam    [facility] Pickard, Priscille Heidelberg, MD

## 2023-12-21 DIAGNOSIS — M810 Age-related osteoporosis without current pathological fracture: Secondary | ICD-10-CM | POA: Diagnosis not present

## 2024-01-20 DIAGNOSIS — Z681 Body mass index (BMI) 19 or less, adult: Secondary | ICD-10-CM | POA: Diagnosis not present

## 2024-01-20 DIAGNOSIS — Z1231 Encounter for screening mammogram for malignant neoplasm of breast: Secondary | ICD-10-CM | POA: Diagnosis not present

## 2024-01-20 DIAGNOSIS — Z124 Encounter for screening for malignant neoplasm of cervix: Secondary | ICD-10-CM | POA: Diagnosis not present

## 2024-01-20 DIAGNOSIS — M810 Age-related osteoporosis without current pathological fracture: Secondary | ICD-10-CM | POA: Diagnosis not present

## 2024-03-03 ENCOUNTER — Ambulatory Visit: Payer: Self-pay | Admitting: *Deleted

## 2024-03-03 NOTE — Telephone Encounter (Signed)
 FYI Only or Action Required?: FYI only for provider: appointment scheduled on 10/31.  Patient was last seen in primary care on 01/13/2023 by Duanne Butler DASEN, MD.  Called Nurse Triage reporting Neck Pain.  Symptoms began a week ago.  Interventions attempted: ASA.  Symptoms are: gradually worsening.  Triage Disposition: See PCP When Office is Open (Within 3 Days)  Patient/caregiver understands and will follow disposition?: Yes  Copied from CRM [id number]. Topic: Clinical - Red Word Triage >> Mar 03, 2024 11:32 AM Antony RAMAN wrote: Red Word that prompted transfer to Nurse Triage: bad neck pain radiating down into back Reason for Disposition  [1] MODERATE neck pain (e.g., interferes with normal activities) AND [2] present > 3 days  Answer Assessment - Initial Assessment Questions 1. ONSET: When did the pain begin?      1 week 2. LOCATION: Where does it hurt?      Base of neck- knot, pain into head and back- R side 3. PATTERN Does the pain come and go, or has it been constant since it started?      constant 4. SEVERITY: How bad is the pain?  (Scale 0-10; or none or slight stiffness, mild, moderate, severe)     7/10 5. RADIATION: Does the pain go anywhere else, shoot into your arms?     Into head and back 6. CORD SYMPTOMS: Any weakness or numbness of the arms or legs?     Numbness on R side of spine at base of neck 7. CAUSE: What do you think is causing the neck pain?     Patient has osteoporosis, arthritis 8. NECK OVERUSE: Any recent activities that involved turning or twisting the neck?     Patient has been babysitting daily 9. OTHER SYMPTOMS: Do you have any other symptoms? (e.g., headache, fever, chest pain, difficulty breathing, neck swelling)     Swelling at the neck, sometimes feels like she has difficulty with swallowing  Protocols used: Neck Pain or Stiffness-A-AH

## 2024-03-04 ENCOUNTER — Encounter: Payer: Self-pay | Admitting: Family Medicine

## 2024-03-04 ENCOUNTER — Ambulatory Visit (INDEPENDENT_AMBULATORY_CARE_PROVIDER_SITE_OTHER): Admitting: Family Medicine

## 2024-03-04 VITALS — BP 120/62 | HR 72 | Temp 97.6°F | Ht <= 58 in | Wt 97.2 lb

## 2024-03-04 DIAGNOSIS — M5412 Radiculopathy, cervical region: Secondary | ICD-10-CM | POA: Diagnosis not present

## 2024-03-04 MED ORDER — PREDNISONE 20 MG PO TABS: ORAL_TABLET | ORAL | 0 refills | Status: AC

## 2024-03-04 NOTE — Progress Notes (Signed)
 Subjective:    Patient ID: Karen Barnett, female    DOB: Sep 05, 1955, 68 y.o.   MRN: [id number]   Patient complains of pain in the right side of her neck.  She states that there has been pain there for several months.  She reports paresthesias radiating up the right side of her neck to the base of her occiput.  She also has numbness and paresthesias radiating into the scapula and to the posterior right shoulder.  She has no weakness in the right arm.  She has full range of motion of the right shoulder.  She has a negative empty can sign and no pain with range of motion.  Muscle strength is 5/5 and equal and symmetric in the upper and lower extremities.  She has normal reflexes at the biceps and brachioradialis in the right arm and normal grip strength.  However she has a aching pain in the right side of the neck that will not improve. Past Medical History:  Diagnosis Date   Anxiety    situational   Crohn's colitis (HCC)    Crohn's disease (HCC)    Osteoporosis    Popliteal aneurysm    Right   Past Surgical History:  Procedure Laterality Date   ABDOMINAL AORTAGRAM     ABDOMINAL AORTAGRAM N/A 08/01/2014   Procedure: ABDOMINAL EZELLA;  Surgeon: Gaile LELON New, MD;  Location: [facility] CATH LAB;  Service: Cardiovascular;  Laterality: N/A;   BREAST REDUCTION SURGERY Bilateral 10/28/2022   Procedure: MAMMARY REDUCTION  (BREAST);  Surgeon: Waddell Leonce NOVAK, MD;  Location: [facility];  Service: Plastics;  Laterality: Bilateral;   COLON SURGERY     COLONOSCOPY W/ BIOPSIES AND POLYPECTOMY     DILATION AND CURETTAGE OF UTERUS     FEMORAL-POPLITEAL BYPASS GRAFT Right 08/02/2014   Procedure: Resection Right popliteal artery aneurysm with Insertion of Reversed Saphenous Vein Graft Above Knee to Above Knee Popliteal;  Surgeon: Lynwood JONETTA Collum, MD;  Location: [facility] OR;  Service: Vascular;  Laterality: Right;   VEIN HARVEST Right 08/02/2014   Procedure: Saphenous VEIN HARVEST Right Leg;  Surgeon:  Lynwood JONETTA Collum, MD;  Location: [facility] OR;  Service: Vascular;  Laterality: Right;   WISDOM TOOTH EXTRACTION     Current Outpatient Medications on File Prior to Visit  Medication Sig Dispense Refill   aspirin  EC 81 MG tablet Take 1 tablet (81 mg total) by mouth daily.     cyanocobalamin  (VITAMIN B12) 1000 MCG/ML injection INJECT 1 ML INTO THE MUSCLE ONCE A MONTH. 3 mL 2   denosumab (PROLIA) 60 MG/ML SOSY injection Inject 60 mg into the skin every 6 (six) months.     erythromycin  ophthalmic ointment Place a 1/2 inch ribbon of ointment into the right lower eyelid BID. 3.5 g 0   No current facility-administered medications on file prior to visit.   No Known Allergies Social History   Socioeconomic History   Marital status: Widowed    Spouse name: Not on file   Number of children: Not on file   Years of education: Not on file   Highest education level: Not on file  Occupational History   Not on file  Tobacco Use   Smoking status: Former    Current packs/day: 0.00    Types: Cigarettes    Quit date: 02/14/2009    Years since quitting: 15.0   Smokeless tobacco: Never  Vaping Use   Vaping status: Never Used  Substance and Sexual Activity  Alcohol use: Not Currently    Alcohol/week: 14.0 standard drinks of alcohol    Types: 14 Standard drinks or equivalent per week   Drug use: Yes    Types: Marijuana   Sexual activity: Yes  Other Topics Concern   Not on file  Social History Narrative   Husband passed away in 03/11/20.    Father passed away 06-18-2021.   Social Drivers of Corporate Investment Banker Strain: Not on file  Food Insecurity: Not on file  Transportation Needs: Not on file  Physical Activity: Not on file  Stress: Not on file  Social Connections: Not on file  Intimate Partner Violence: Not on file   Family History  Problem Relation Age of Onset   Diabetes Father    Diabetes Brother    Diabetes Brother      Review of Systems  All other systems reviewed and are  negative.      Objective:   Physical Exam Vitals reviewed.  Constitutional:      General: She is not in acute distress.    Appearance: Normal appearance. She is well-developed and normal weight. She is not diaphoretic.  Neck:     Vascular: No JVD.   Cardiovascular:     Rate and Rhythm: Normal rate and regular rhythm.     Pulses: Normal pulses.     Heart sounds: Normal heart sounds. No murmur heard.    No friction rub. No gallop.  Pulmonary:     Effort: Pulmonary effort is normal. No respiratory distress.     Breath sounds: Normal breath sounds. No wheezing or rales.  Chest:     Chest wall: No tenderness.  Musculoskeletal:     Cervical back: Neck supple. Tenderness present. No rigidity. Pain with movement present. No spinous process tenderness or muscular tenderness.     Right lower leg: No edema.     Left lower leg: No edema.  Lymphadenopathy:     Cervical: No cervical adenopathy.  Skin:    Coloration: Skin is not jaundiced.     Findings: No bruising, erythema or lesion.  Neurological:     General: No focal deficit present.     Mental Status: She is alert and oriented to person, place, and time. Mental status is at baseline.     Cranial Nerves: No cranial nerve deficit.     Sensory: No sensory deficit.     Motor: No weakness.     Coordination: Coordination normal.     Gait: Gait normal.     Deep Tendon Reflexes: Reflexes normal.  Psychiatric:        Mood and Affect: Mood normal.        Behavior: Behavior normal.        Thought Content: Thought content normal.        Judgment: Judgment normal.         Assessment & Plan:  Cervical radiculopathy - Plan: DG Cervical Spine Complete  Based on her history, I believe the patient is dealing with cervical radiculopathy.  Begin a prednisone taper pack to try to assist in the pain.  Meanwhile obtain an x-ray of the neck to evaluate further.  Consider an MRI if symptoms persist.  She may be a good candidate for epidural  steroid injections given the duration she has been dealing with this pain if there is in fact nerve impingement confirmed

## 2024-03-07 ENCOUNTER — Ambulatory Visit (HOSPITAL_COMMUNITY)
Admission: RE | Admit: 2024-03-07 | Discharge: 2024-03-07 | Disposition: A | Discharge status: Home / Self Care | Source: Ambulatory Visit | Attending: Family Medicine | Admitting: Family Medicine

## 2024-03-07 DIAGNOSIS — M50121 Cervical disc disorder at C4-C5 level with radiculopathy: Secondary | ICD-10-CM | POA: Diagnosis not present

## 2024-03-07 DIAGNOSIS — M5412 Radiculopathy, cervical region: Secondary | ICD-10-CM | POA: Insufficient documentation

## 2024-03-07 DIAGNOSIS — M4722 Other spondylosis with radiculopathy, cervical region: Secondary | ICD-10-CM | POA: Diagnosis not present

## 2024-03-14 ENCOUNTER — Ambulatory Visit: Payer: Self-pay | Admitting: Family Medicine

## 2024-03-14 NOTE — Telephone Encounter (Signed)
 Copied from CRM [id number]. Topic: Clinical - Lab/Test Results >> Mar 14, 2024  2:20 PM Tobias L wrote: Reason for CRM: Patient returning call for x ray results. Requesting call back, [phone]

## 2024-05-18 ENCOUNTER — Other Ambulatory Visit: Payer: Self-pay | Admitting: Family Medicine

## 2024-05-18 DIAGNOSIS — E538 Deficiency of other specified B group vitamins: Secondary | ICD-10-CM

## 2024-05-18 DIAGNOSIS — K5010 Crohn's disease of large intestine without complications: Secondary | ICD-10-CM

## 2024-05-18 NOTE — Telephone Encounter (Signed)
 Prescription Request  05/18/2024  LOV: 03/04/2024  What is the name of the medication or equipment?   cyanocobalamin  (VITAMIN B12) 1000 MCG/ML injection   Have you contacted your pharmacy to request a refill? Yes   Which pharmacy would you like this sent to?  [id number] [place], [place] - [address] [place] [address] Phone: [phone] Fax: [phone]    Patient notified that their request is being sent to the clinical staff for review and that they should receive a response within 2 business days.   Please advise pharmacist.

## 2024-05-19 NOTE — Telephone Encounter (Signed)
 Requested medication (s) are due for refill today -unsure  Requested medication (s) are on the active medication list -yes  Future visit scheduled -no  Last refill: 08/14/23 3ml 2RF  Notes to clinic: fails lab protocol- over 1 year- 01/13/23  Requested Prescriptions  Pending Prescriptions Disp Refills   cyanocobalamin  (VITAMIN B12) 1000 MCG/ML injection 3 mL 2    Sig: Inject 1 ml into the muscle once a month.     Endocrinology:  Vitamins - Vitamin B12 Failed - 05/19/2024  3:51 PM      Failed - HCT in normal range and within 360 days    HCT  Date Value Ref Range Status  01/13/2023 39.6 35.0 - 45.0 % Final         Failed - HGB in normal range and within 360 days    Hemoglobin  Date Value Ref Range Status  01/13/2023 13.3 11.7 - 15.5 g/dL Final         Failed - B12 Level in normal range and within 360 days    Vitamin B-12  Date Value Ref Range Status  01/13/2023 324 200 - 1,100 pg/mL Final    Comment:    . Please Note: Although the reference range for vitamin B12 is [phone] pg/mL, it has been reported that between 5 and 10% of patients with values between 200 and 400 pg/mL may experience neuropsychiatric and hematologic abnormalities due to occult B12 deficiency; less than 1% of patients with values above 400 pg/mL will have symptoms. .          Failed - Valid encounter within last 12 months    Recent Outpatient Visits           2 months ago Cervical radiculopathy   [place] [facility] Pickard, Butler DASEN, MD   1 year ago Fatigue, unspecified type   [place] [facility] Duanne, Butler DASEN, MD   1 year ago Viral URI   [place] [facility] Kayla Jeoffrey RAMAN, FNP   1 year ago Cat scratch of left hand, initial encounter   [place] [facility] Duanne Butler DASEN, MD   2 years ago General medical exam   [place] [facility] Duanne Butler DASEN, MD                  Requested Prescriptions  Pending Prescriptions Disp Refills   cyanocobalamin  (VITAMIN B12) 1000 MCG/ML injection 3 mL 2    Sig: Inject 1 ml into the muscle once a month.     Endocrinology:  Vitamins - Vitamin B12 Failed - 05/19/2024  3:51 PM      Failed - HCT in normal range and within 360 days    HCT  Date Value Ref Range Status  01/13/2023 39.6 35.0 - 45.0 % Final         Failed - HGB in normal range and within 360 days    Hemoglobin  Date Value Ref Range Status  01/13/2023 13.3 11.7 - 15.5 g/dL Final         Failed - B12 Level in normal range and within 360 days    Vitamin B-12  Date Value Ref Range Status  01/13/2023 324 200 - 1,100 pg/mL Final    Comment:    . Please Note: Although the reference range for vitamin B12 is [phone] pg/mL, it has been reported that between 5 and 10% of patients with values between 200 and  400 pg/mL may experience neuropsychiatric and hematologic abnormalities due to occult B12 deficiency; less than 1% of patients with values above 400 pg/mL will have symptoms. .          Failed - Valid encounter within last 12 months    Recent Outpatient Visits           2 months ago Cervical radiculopathy   [place] [facility] Pickard, Butler DASEN, MD   1 year ago Fatigue, unspecified type   [place] [facility] Duanne, Butler DASEN, MD   1 year ago Viral URI   [place] [facility] Kayla Jeoffrey RAMAN, FNP   1 year ago Cat scratch of left hand, initial encounter   [place] [facility] Duanne Butler DASEN, MD   2 years ago General medical exam   [place] [facility] Pickard, Butler DASEN, MD

## 2024-05-20 MED ORDER — CYANOCOBALAMIN 1000 MCG/ML IJ SOLN: INTRAMUSCULAR | 2 refills | Status: AC

## 2024-05-25 NOTE — Telephone Encounter (Signed)
 Pharmacy sent eFax to follow up on refill requested for cyanocobalamin  (VITAMIN B12) 1000 MCG/ML injection   Pharmacy:   [id number] [place], [place] - [address], [place] [address] Phone: [phone]  Fax: [phone] DEA #: [id number]   Please advise pharmacist.
